# Patient Record
Sex: Male | Born: 1950 | Race: White | Hispanic: No | Marital: Married | Smoking: Former smoker
Health system: Southern US, Community
[De-identification: ages and names within clinical notes are randomized; demographics above are authoritative.]

## PROBLEM LIST (undated history)

## (undated) DIAGNOSIS — F329 Major depressive disorder, single episode, unspecified: Secondary | ICD-10-CM

## (undated) DIAGNOSIS — I10 Essential (primary) hypertension: Secondary | ICD-10-CM

## (undated) DIAGNOSIS — D649 Anemia, unspecified: Secondary | ICD-10-CM

## (undated) DIAGNOSIS — E079 Disorder of thyroid, unspecified: Secondary | ICD-10-CM

## (undated) DIAGNOSIS — F419 Anxiety disorder, unspecified: Secondary | ICD-10-CM

## (undated) DIAGNOSIS — K219 Gastro-esophageal reflux disease without esophagitis: Secondary | ICD-10-CM

## (undated) DIAGNOSIS — M1990 Unspecified osteoarthritis, unspecified site: Secondary | ICD-10-CM

## (undated) DIAGNOSIS — N179 Acute kidney failure, unspecified: Secondary | ICD-10-CM

## (undated) DIAGNOSIS — E785 Hyperlipidemia, unspecified: Secondary | ICD-10-CM

## (undated) DIAGNOSIS — F32A Depression, unspecified: Secondary | ICD-10-CM

## (undated) DIAGNOSIS — F1910 Other psychoactive substance abuse, uncomplicated: Secondary | ICD-10-CM

## (undated) HISTORY — DX: Major depressive disorder, single episode, unspecified: F32.9

## (undated) HISTORY — DX: Anxiety disorder, unspecified: F41.9

## (undated) HISTORY — DX: Hyperlipidemia, unspecified: E78.5

## (undated) HISTORY — DX: Other psychoactive substance abuse, uncomplicated: F19.10

## (undated) HISTORY — PX: TONSILLECTOMY: SUR1361

## (undated) HISTORY — DX: Depression, unspecified: F32.A

## (undated) HISTORY — PX: ROTATOR CUFF REPAIR: SHX139

## (undated) HISTORY — DX: Gastro-esophageal reflux disease without esophagitis: K21.9

## (undated) HISTORY — DX: Unspecified osteoarthritis, unspecified site: M19.90

## (undated) HISTORY — DX: Essential (primary) hypertension: I10

## (undated) HISTORY — DX: Disorder of thyroid, unspecified: E07.9

## (undated) HISTORY — PX: OTHER SURGICAL HISTORY: SHX169

---

## 2000-09-28 ENCOUNTER — Encounter: Payer: Self-pay | Admitting: Internal Medicine

## 2000-09-28 ENCOUNTER — Ambulatory Visit (HOSPITAL_COMMUNITY): Admission: RE | Admit: 2000-09-28 | Discharge: 2000-09-28 | Payer: Self-pay | Admitting: Internal Medicine

## 2001-07-23 ENCOUNTER — Ambulatory Visit (HOSPITAL_COMMUNITY): Admission: RE | Admit: 2001-07-23 | Discharge: 2001-07-23 | Payer: Self-pay | Admitting: Family Medicine

## 2001-07-23 ENCOUNTER — Encounter: Payer: Self-pay | Admitting: Family Medicine

## 2001-08-16 ENCOUNTER — Encounter (HOSPITAL_COMMUNITY): Admission: RE | Admit: 2001-08-16 | Discharge: 2001-09-15 | Payer: Self-pay | Admitting: *Deleted

## 2001-09-15 ENCOUNTER — Encounter (HOSPITAL_COMMUNITY): Admission: RE | Admit: 2001-09-15 | Discharge: 2001-10-15 | Payer: Self-pay | Admitting: Orthopedic Surgery

## 2001-10-15 ENCOUNTER — Encounter (HOSPITAL_COMMUNITY): Admission: RE | Admit: 2001-10-15 | Discharge: 2001-11-14 | Payer: Self-pay | Admitting: Orthopedic Surgery

## 2001-11-15 ENCOUNTER — Encounter (HOSPITAL_COMMUNITY): Admission: RE | Admit: 2001-11-15 | Discharge: 2001-12-15 | Payer: Self-pay | Admitting: Orthopedic Surgery

## 2003-09-26 ENCOUNTER — Ambulatory Visit (HOSPITAL_COMMUNITY): Admission: RE | Admit: 2003-09-26 | Discharge: 2003-09-26 | Payer: Self-pay

## 2013-08-31 ENCOUNTER — Telehealth: Payer: Self-pay | Admitting: Family Medicine

## 2013-09-14 NOTE — Telephone Encounter (Signed)
Message left that if patient still wants to be seen to please call our office.

## 2013-11-14 ENCOUNTER — Ambulatory Visit (HOSPITAL_COMMUNITY)
Admission: RE | Admit: 2013-11-14 | Discharge: 2013-11-14 | Disposition: A | Discharge status: Home / Self Care | Payer: Medicare PPO | Source: Ambulatory Visit | Attending: Family Medicine | Admitting: Family Medicine

## 2013-11-14 ENCOUNTER — Other Ambulatory Visit (HOSPITAL_COMMUNITY): Payer: Self-pay | Admitting: Family Medicine

## 2013-11-14 DIAGNOSIS — F17200 Nicotine dependence, unspecified, uncomplicated: Secondary | ICD-10-CM

## 2013-11-14 DIAGNOSIS — Z87891 Personal history of nicotine dependence: Secondary | ICD-10-CM | POA: Insufficient documentation

## 2015-02-19 DIAGNOSIS — X32XXXA Exposure to sunlight, initial encounter: Secondary | ICD-10-CM | POA: Diagnosis not present

## 2015-02-19 DIAGNOSIS — L309 Dermatitis, unspecified: Secondary | ICD-10-CM | POA: Diagnosis not present

## 2015-02-19 DIAGNOSIS — D225 Melanocytic nevi of trunk: Secondary | ICD-10-CM | POA: Diagnosis not present

## 2015-02-19 DIAGNOSIS — L570 Actinic keratosis: Secondary | ICD-10-CM | POA: Diagnosis not present

## 2015-03-06 DIAGNOSIS — E780 Pure hypercholesterolemia: Secondary | ICD-10-CM | POA: Diagnosis not present

## 2015-03-06 DIAGNOSIS — S46001D Unspecified injury of muscle(s) and tendon(s) of the rotator cuff of right shoulder, subsequent encounter: Secondary | ICD-10-CM | POA: Diagnosis not present

## 2015-05-08 DIAGNOSIS — M62830 Muscle spasm of back: Secondary | ICD-10-CM | POA: Diagnosis not present

## 2015-05-08 DIAGNOSIS — G8929 Other chronic pain: Secondary | ICD-10-CM | POA: Diagnosis not present

## 2015-05-08 DIAGNOSIS — I10 Essential (primary) hypertension: Secondary | ICD-10-CM | POA: Diagnosis not present

## 2015-05-08 DIAGNOSIS — R252 Cramp and spasm: Secondary | ICD-10-CM | POA: Diagnosis not present

## 2015-08-23 ENCOUNTER — Telehealth: Payer: Self-pay | Admitting: Family Medicine

## 2015-08-23 NOTE — Telephone Encounter (Signed)
LM to set up apt with Dr. Nelson °

## 2015-09-10 ENCOUNTER — Encounter: Payer: Self-pay | Admitting: Family Medicine

## 2015-09-10 ENCOUNTER — Encounter (INDEPENDENT_AMBULATORY_CARE_PROVIDER_SITE_OTHER): Payer: Self-pay

## 2015-09-10 ENCOUNTER — Ambulatory Visit (INDEPENDENT_AMBULATORY_CARE_PROVIDER_SITE_OTHER): Payer: PPO | Admitting: Family Medicine

## 2015-09-10 VITALS — BP 138/82 | HR 60 | Resp 18 | Ht 72.0 in | Wt 250.1 lb

## 2015-09-10 DIAGNOSIS — Z23 Encounter for immunization: Secondary | ICD-10-CM

## 2015-09-10 DIAGNOSIS — Z7689 Persons encountering health services in other specified circumstances: Secondary | ICD-10-CM

## 2015-09-10 DIAGNOSIS — Z7189 Other specified counseling: Secondary | ICD-10-CM | POA: Diagnosis not present

## 2015-09-10 DIAGNOSIS — G8929 Other chronic pain: Secondary | ICD-10-CM

## 2015-09-10 DIAGNOSIS — F1090 Alcohol use, unspecified, uncomplicated: Secondary | ICD-10-CM

## 2015-09-10 DIAGNOSIS — E785 Hyperlipidemia, unspecified: Secondary | ICD-10-CM | POA: Diagnosis not present

## 2015-09-10 DIAGNOSIS — I10 Essential (primary) hypertension: Secondary | ICD-10-CM

## 2015-09-10 DIAGNOSIS — M549 Dorsalgia, unspecified: Secondary | ICD-10-CM

## 2015-09-10 DIAGNOSIS — Z789 Other specified health status: Secondary | ICD-10-CM | POA: Insufficient documentation

## 2015-09-10 DIAGNOSIS — E039 Hypothyroidism, unspecified: Secondary | ICD-10-CM

## 2015-09-10 DIAGNOSIS — K219 Gastro-esophageal reflux disease without esophagitis: Secondary | ICD-10-CM

## 2015-09-10 NOTE — Progress Notes (Addendum)
Chief Complaint  Patient presents with  . Establish Care    previous PCP Dr. 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 He lives with his wife. He works part-time at a [employer] course. He was previously in [employer] and gets yearly checkups at the [facility]. He has no disability.  No old records are available for review. They're requested today.  The patient complains of chronic low-back pain. He states he has been told he has arthritis in his back. He has not had x-rays for several years. He has not had any conservative care including consultation with back specialty, physical therapy, chiropractic care, acupuncture or massage. He currently takes ibuprofen 2 pills a day. He takes gabapentin and methocarbamol as needed. He takes Tylenol with Codeine 2-3 times a day. He states that when he has back spasms that he will take methocarbamol up to 3 or 4 pills at a time. He states he previously went to the [facility] for pain management. He was not given narcotic pain medication because his drug screen tested positive for marijuana. He states he smokes marijuana once or twice a day for "pain". The pain is largely in the low back. It is present daily. No radiation of the legs, no numbness or weakness. No injury.  Patient has hypothyroidism and high cholesterol, well-controlled per history. No recent records. Blood pressure is well controlled by history. He takes omeprazole for GERD with good results.  Social history reviewed with patient. Used to smoke but quit 30 years ago. He denies any smoking related illness, lung or heart disease. He drinks alcohol daily. He states he previously drank up to 12 alcoholic beverages per day, currently drinks "4 or 5". He denies problem drinking.  Patient Active Problem List   Diagnosis Date Noted  . Essential hypertension 09/10/2015  . Hyperlipidemia 09/10/2015  .  Chronic back pain greater than 3 months duration 09/10/2015  . Heavy alcohol use 09/10/2015  . GERD (gastroesophageal reflux disease) 09/10/2015  . Hypothyroidism 09/10/2015   Outpatient Encounter Prescriptions as of 09/10/2015  Medication Sig Note  . acetaminophen-codeine (TYLENOL #3) 300-30 MG tablet  09/10/2015: Received from: External Pharmacy  . atorvastatin (LIPITOR) 10 MG tablet  09/10/2015: Received from: External Pharmacy  . gabapentin (NEURONTIN) 100 MG capsule Take 100 mg by mouth 3 (three) times daily.   Marland Kitchen ibuprofen (ADVIL,MOTRIN) 800 MG tablet Take 800 mg by mouth every 8 (eight) hours as needed.   Marland Kitchen levothyroxine (SYNTHROID, LEVOTHROID) 25 MCG tablet Take 25 mcg by mouth daily before breakfast.   . lisinopril-hydrochlorothiazide (PRINZIDE,ZESTORETIC) 20-12.5 MG tablet Take 1 tablet by mouth 2 (two) times daily.   . methocarbamol (ROBAXIN) 750 MG tablet Take 750 mg by mouth daily.   Marland Kitchen omeprazole (PRILOSEC) 20 MG capsule  09/10/2015: Received from: External Pharmacy  . sertraline (ZOLOFT) 100 MG tablet Take 100 mg by mouth daily.   Marland Kitchen triamcinolone cream (KENALOG) 0.1 % Apply 1 application topically 2 (two) times daily.    No facility-administered encounter medications on file as of 09/10/2015.   BP 138/82   Pulse 60   Resp 18   Ht 6' (1.829 m)   Wt 250 lb 1.9 oz (113.5 kg)   SpO2 99%   BMI 33.92 kg/m  Review of Systems  Constitutional: Negative for chills, fever and weight loss.  HENT: Negative for congestion  and hearing loss.   Eyes: Negative for blurred vision and pain.  Respiratory: Negative for cough and shortness of breath.   Cardiovascular: Negative for chest pain and leg swelling.  Gastrointestinal: Positive for heartburn. Negative for abdominal pain, constipation and diarrhea.  Genitourinary: Negative for dysuria and frequency.  Musculoskeletal: Positive for back pain. Negative for falls, joint pain and myalgias.  Neurological: Negative for dizziness, seizures and  headaches.  Psychiatric/Behavioral: Negative for depression. The patient is not nervous/anxious and does not have insomnia.    Physical Exam  Constitutional: He is oriented to person, place, and time and well-developed, well-nourished, and in no distress. No distress.  HENT:  Head: Normocephalic and atraumatic.  Mouth/Throat: Oropharynx is clear and moist.  Eyes: Pupils are equal, round, and reactive to light.  Neck: Normal range of motion. Neck supple. No thyromegaly present.  Cardiovascular: Normal rate, regular rhythm and normal heart sounds.   Pulmonary/Chest: Effort normal and breath sounds normal.  Abdominal: Soft. Bowel sounds are normal.  Musculoskeletal: Normal range of motion. He exhibits no edema.  Lymphadenopathy:    He has no cervical adenopathy.  Neurological: He is alert and oriented to person, place, and time. Gait normal.  Skin: Skin is warm and dry.  Psychiatric: Memory, affect and judgment normal.   1. Encounter to establish care with new doctor   2. Essential hypertension controlled  3. Hyperlipidemia Need labs  4. Chronic back pain greater than 3 months duration Ongoing narcotic Rx.  Will not be continued.  Needs back pain workup and conservative management and adjustment of non narcotic medications  5. Heavy alcohol use Advised to reduce.  Daily marijuana use.  Advised this will limit my ability to give Rx for controlled meds  6. Gastroesophageal reflux disease, esophagitis presence not specified controlled  7. Hypothyroidism, unspecified hypothyroidism type Need labs/records  8. Encounter for immunization  - Flu Vaccine QUAD 36+ mos IM  Discharge Instructions    Flu Vaccine QUAD 36+ mos IM    Complete by:  As directed     followup in one month

## 2015-09-10 NOTE — Patient Instructions (Addendum)
Please get release signed for [place] to get x rays and records from the [place]  Need records Dr Emilee Hero office  Stay as active as you can manage  Continue current medicines  See me in a month to further evaluate and treat your back pain

## 2015-09-25 ENCOUNTER — Encounter: Payer: Self-pay | Admitting: Family Medicine

## 2015-09-25 DIAGNOSIS — R945 Abnormal results of liver function studies: Secondary | ICD-10-CM

## 2015-09-25 DIAGNOSIS — E559 Vitamin D deficiency, unspecified: Secondary | ICD-10-CM | POA: Insufficient documentation

## 2015-09-25 DIAGNOSIS — F329 Major depressive disorder, single episode, unspecified: Secondary | ICD-10-CM | POA: Insufficient documentation

## 2015-10-01 ENCOUNTER — Ambulatory Visit: Payer: Self-pay | Admitting: Family Medicine

## 2015-10-11 ENCOUNTER — Ambulatory Visit (INDEPENDENT_AMBULATORY_CARE_PROVIDER_SITE_OTHER): Payer: PPO | Admitting: Family Medicine

## 2015-10-11 ENCOUNTER — Encounter: Payer: Self-pay | Admitting: Family Medicine

## 2015-10-11 VITALS — BP 138/80 | HR 58 | Ht 72.0 in | Wt 248.0 lb

## 2015-10-11 DIAGNOSIS — I10 Essential (primary) hypertension: Secondary | ICD-10-CM | POA: Diagnosis not present

## 2015-10-11 DIAGNOSIS — E785 Hyperlipidemia, unspecified: Secondary | ICD-10-CM | POA: Diagnosis not present

## 2015-10-11 DIAGNOSIS — E039 Hypothyroidism, unspecified: Secondary | ICD-10-CM | POA: Diagnosis not present

## 2015-10-11 DIAGNOSIS — G8929 Other chronic pain: Secondary | ICD-10-CM

## 2015-10-11 DIAGNOSIS — M549 Dorsalgia, unspecified: Secondary | ICD-10-CM

## 2015-10-11 DIAGNOSIS — K219 Gastro-esophageal reflux disease without esophagitis: Secondary | ICD-10-CM | POA: Diagnosis not present

## 2015-10-11 NOTE — Patient Instructions (Signed)
You are doing well  Continue to stay active and walk or golf every day you are able  Take all medicine as prescribed  See me in 6 months Labs and PE next time (46min)  Call sooner for problems

## 2015-10-11 NOTE — Progress Notes (Signed)
Chief Complaint  Patient presents with  . Back Pain    still having pain in his mid- low back and sometimes it travels down his right hip and leg. Rates pain today 4.   Patient is here for routine follow-up Blood pressure is elevated initially. After sitting and waiting for 10 minutes his blood pressure did come down. He states his blood pressure at home is consistently in the 130s to 140/80 range GERD is well controlled on his medication. Patient is on hyperlipidemia medicine. No recent labs. He'll get labs with his next visit. We discussed him his weight. He is overweight. He has lost 2 pounds. I recommended that he walk on a daily basis, especially on the days that he is not coughing. Patient is an alcoholic. He drinks daily. He states he is trying to cut down and is drinking 3-4 beers a day. I reminded him that he had increased liver functions on his last lab work and that he should limit his alcohol to one to 2 drinks a day, or preferably quit We discussed health maintenance. He previously was a smoker. I recommended ultrasound for AAA. He states since he quit smoking 40 years ago his [place] doctor told was not necessary We discussed also colonoscopy. He states he's had negative for testing yearly for 20 years. He does feel colonoscopy as needed. I do think it is recommended, discussed pros and cons, he refuses He has chronic back pain. He states back pain has been better since he and his wife got a new mattress. Offered physical therapy referral to teach him some back exercises and back school, he declines. He'll let me know if his back pain worsens.   Patient Active Problem List   Diagnosis Date Noted  . Vitamin D deficiency 09/25/2015  . Elevated LFTs 09/25/2015  . Depression 09/25/2015  . Essential hypertension 09/10/2015  . Hyperlipidemia 09/10/2015  . Chronic back pain greater than 3 months duration 09/10/2015  . Heavy alcohol use 09/10/2015  . GERD (gastroesophageal reflux  disease) 09/10/2015  . Hypothyroidism 09/10/2015    Outpatient Encounter Prescriptions as of 10/11/2015  Medication Sig  . acetaminophen-codeine (TYLENOL #3) 300-30 MG tablet   . atorvastatin (LIPITOR) 10 MG tablet   . gabapentin (NEURONTIN) 100 MG capsule Take 100 mg by mouth 3 (three) times daily.  Marland Kitchen ibuprofen (ADVIL,MOTRIN) 800 MG tablet Take 800 mg by mouth every 8 (eight) hours as needed.  Marland Kitchen levothyroxine (SYNTHROID, LEVOTHROID) 25 MCG tablet Take 25 mcg by mouth daily before breakfast.  . lisinopril-hydrochlorothiazide (PRINZIDE,ZESTORETIC) 20-12.5 MG tablet Take 1 tablet by mouth 2 (two) times daily.  . methocarbamol (ROBAXIN) 750 MG tablet Take 750 mg by mouth daily.  Marland Kitchen omeprazole (PRILOSEC) 20 MG capsule   . sertraline (ZOLOFT) 100 MG tablet Take 100 mg by mouth daily.  Marland Kitchen triamcinolone cream (KENALOG) 0.1 % Apply 1 application topically 2 (two) times daily.   No facility-administered encounter medications on file as of 10/11/2015.     No Known Allergies  Review of Systems  Constitutional: Negative.  Negative for activity change and appetite change.  HENT: Negative.  Negative for congestion and dental problem.   Eyes: Negative.  Negative for visual disturbance.  Respiratory: Negative.  Negative for cough, shortness of breath and wheezing.   Cardiovascular: Negative.  Negative for chest pain, palpitations and leg swelling.  Gastrointestinal: Negative.  Negative for blood in stool, constipation and diarrhea.  Genitourinary: Negative.   Musculoskeletal: Positive for back pain. Negative  for arthralgias.  Psychiatric/Behavioral: Negative for dysphoric mood and sleep disturbance. The patient is not nervous/anxious.    BP 138/80   Pulse (!) 58   Ht 6' (1.829 m)   Wt 248 lb (112.5 kg)   SpO2 98%   BMI 33.63 kg/m   Physical Exam  Constitutional: He is oriented to person, place, and time. He appears well-developed and well-nourished.  HENT:  Head: Normocephalic and atraumatic.   Mouth/Throat: Oropharynx is clear and moist.  Eyes: Conjunctivae are normal. Pupils are equal, round, and reactive to light.  Neck: Normal range of motion. Neck supple. No thyromegaly present.  Cardiovascular: Normal rate, regular rhythm, normal heart sounds and intact distal pulses.   Pulmonary/Chest: Effort normal and breath sounds normal. No respiratory distress.  Abdominal: Soft. Bowel sounds are normal. There is no tenderness.  Musculoskeletal: Normal range of motion. He exhibits no edema.  Lymphadenopathy:    He has no cervical adenopathy.  Neurological: He is alert and oriented to person, place, and time.  Gait normal  Skin: Skin is warm and dry.  Psychiatric: He has a normal mood and affect. His behavior is normal. Thought content normal.  Nursing note and vitals reviewed.   ASSESSMENT/PLAN:  1. Essential hypertension  - CBC with Differential/Platelet - COMPLETE METABOLIC PANEL WITH GFR  2. Hyperlipidemia  - Lipid panel  3. Hypothyroidism, unspecified hypothyroidism type  - TSH  4. Gastroesophageal reflux disease, esophagitis presence not specified  - VITAMIN D 25 Hydroxy (Vit-D Deficiency, Fractures) - Vitamin B12  5. Chronic back pain    Patient Instructions  You are doing well  Continue to stay active and walk or golf every day you are able  Take all medicine as prescribed  See me in 6 months Labs and PE next time (20min)  Call sooner for problems   Raylene Everts, MD

## 2016-01-29 DIAGNOSIS — D229 Melanocytic nevi, unspecified: Secondary | ICD-10-CM | POA: Diagnosis not present

## 2016-01-29 DIAGNOSIS — B351 Tinea unguium: Secondary | ICD-10-CM | POA: Diagnosis not present

## 2016-01-29 DIAGNOSIS — L821 Other seborrheic keratosis: Secondary | ICD-10-CM | POA: Diagnosis not present

## 2016-01-29 DIAGNOSIS — B356 Tinea cruris: Secondary | ICD-10-CM | POA: Diagnosis not present

## 2016-03-21 ENCOUNTER — Telehealth: Payer: Self-pay | Admitting: Family Medicine

## 2016-04-09 ENCOUNTER — Encounter: Payer: PPO | Admitting: Family Medicine

## 2016-09-30 DIAGNOSIS — F4312 Post-traumatic stress disorder, chronic: Secondary | ICD-10-CM | POA: Diagnosis not present

## 2016-10-15 NOTE — Telephone Encounter (Signed)
Issue resolved.

## 2016-11-11 DIAGNOSIS — F4312 Post-traumatic stress disorder, chronic: Secondary | ICD-10-CM | POA: Diagnosis not present

## 2016-11-24 ENCOUNTER — Telehealth: Payer: Self-pay | Admitting: Family Medicine

## 2016-11-24 NOTE — Telephone Encounter (Signed)
Called to schedule appt for awv with nurse. No answer, lvm

## 2016-12-16 ENCOUNTER — Encounter: Payer: Self-pay | Admitting: Family Medicine

## 2017-03-23 ENCOUNTER — Encounter: Payer: Self-pay | Admitting: Family Medicine

## 2017-05-08 ENCOUNTER — Other Ambulatory Visit (HOSPITAL_COMMUNITY): Payer: Self-pay | Admitting: Physician Assistant

## 2017-05-08 DIAGNOSIS — R945 Abnormal results of liver function studies: Secondary | ICD-10-CM

## 2017-05-13 ENCOUNTER — Ambulatory Visit (HOSPITAL_COMMUNITY): Payer: Non-veteran care

## 2017-05-21 ENCOUNTER — Ambulatory Visit (HOSPITAL_COMMUNITY)
Admission: RE | Admit: 2017-05-21 | Discharge: 2017-05-21 | Disposition: A | Discharge status: Home / Self Care | Payer: PPO | Source: Ambulatory Visit | Attending: Physician Assistant | Admitting: Physician Assistant

## 2017-05-21 DIAGNOSIS — N281 Cyst of kidney, acquired: Secondary | ICD-10-CM | POA: Diagnosis not present

## 2017-05-21 DIAGNOSIS — R161 Splenomegaly, not elsewhere classified: Secondary | ICD-10-CM | POA: Insufficient documentation

## 2017-05-21 DIAGNOSIS — R945 Abnormal results of liver function studies: Secondary | ICD-10-CM

## 2017-05-21 DIAGNOSIS — K8020 Calculus of gallbladder without cholecystitis without obstruction: Secondary | ICD-10-CM | POA: Diagnosis not present

## 2017-12-17 DIAGNOSIS — F4312 Post-traumatic stress disorder, chronic: Secondary | ICD-10-CM | POA: Diagnosis not present

## 2018-02-01 DIAGNOSIS — F4312 Post-traumatic stress disorder, chronic: Secondary | ICD-10-CM | POA: Diagnosis not present

## 2018-03-08 DIAGNOSIS — F4312 Post-traumatic stress disorder, chronic: Secondary | ICD-10-CM | POA: Diagnosis not present

## 2018-05-18 ENCOUNTER — Other Ambulatory Visit (HOSPITAL_COMMUNITY): Payer: Self-pay | Admitting: Family

## 2018-05-18 DIAGNOSIS — R748 Abnormal levels of other serum enzymes: Secondary | ICD-10-CM

## 2018-06-02 DIAGNOSIS — I10 Essential (primary) hypertension: Secondary | ICD-10-CM | POA: Diagnosis not present

## 2018-06-02 DIAGNOSIS — E039 Hypothyroidism, unspecified: Secondary | ICD-10-CM | POA: Diagnosis not present

## 2018-06-02 DIAGNOSIS — R600 Localized edema: Secondary | ICD-10-CM | POA: Diagnosis not present

## 2018-06-02 DIAGNOSIS — K219 Gastro-esophageal reflux disease without esophagitis: Secondary | ICD-10-CM | POA: Diagnosis not present

## 2018-06-16 DIAGNOSIS — I10 Essential (primary) hypertension: Secondary | ICD-10-CM | POA: Diagnosis not present

## 2018-06-16 DIAGNOSIS — T7849XA Other allergy, initial encounter: Secondary | ICD-10-CM | POA: Diagnosis not present

## 2018-06-16 DIAGNOSIS — R21 Rash and other nonspecific skin eruption: Secondary | ICD-10-CM | POA: Diagnosis not present

## 2018-06-22 ENCOUNTER — Ambulatory Visit (HOSPITAL_COMMUNITY): Payer: Non-veteran care

## 2018-06-22 ENCOUNTER — Encounter (HOSPITAL_COMMUNITY): Payer: Self-pay

## 2018-06-24 DIAGNOSIS — Z1159 Encounter for screening for other viral diseases: Secondary | ICD-10-CM | POA: Diagnosis not present

## 2018-06-30 DIAGNOSIS — Z125 Encounter for screening for malignant neoplasm of prostate: Secondary | ICD-10-CM | POA: Diagnosis not present

## 2018-06-30 DIAGNOSIS — Z0000 Encounter for general adult medical examination without abnormal findings: Secondary | ICD-10-CM | POA: Diagnosis not present

## 2018-06-30 DIAGNOSIS — E039 Hypothyroidism, unspecified: Secondary | ICD-10-CM | POA: Diagnosis not present

## 2018-06-30 DIAGNOSIS — I10 Essential (primary) hypertension: Secondary | ICD-10-CM | POA: Diagnosis not present

## 2018-07-07 DIAGNOSIS — R770 Abnormality of albumin: Secondary | ICD-10-CM | POA: Diagnosis not present

## 2018-07-07 DIAGNOSIS — Z0001 Encounter for general adult medical examination with abnormal findings: Secondary | ICD-10-CM | POA: Diagnosis not present

## 2018-07-07 DIAGNOSIS — E559 Vitamin D deficiency, unspecified: Secondary | ICD-10-CM | POA: Diagnosis not present

## 2018-07-07 DIAGNOSIS — D696 Thrombocytopenia, unspecified: Secondary | ICD-10-CM | POA: Diagnosis not present

## 2018-08-03 ENCOUNTER — Ambulatory Visit (HOSPITAL_COMMUNITY)
Admission: RE | Admit: 2018-08-03 | Discharge: 2018-08-03 | Disposition: A | Discharge status: Home / Self Care | Payer: No Typology Code available for payment source | Source: Ambulatory Visit | Attending: Family | Admitting: Family

## 2018-08-03 ENCOUNTER — Other Ambulatory Visit: Payer: Self-pay

## 2018-08-03 DIAGNOSIS — R748 Abnormal levels of other serum enzymes: Secondary | ICD-10-CM | POA: Diagnosis present

## 2018-09-08 DIAGNOSIS — E039 Hypothyroidism, unspecified: Secondary | ICD-10-CM | POA: Diagnosis not present

## 2018-09-08 DIAGNOSIS — I10 Essential (primary) hypertension: Secondary | ICD-10-CM | POA: Diagnosis not present

## 2018-09-08 DIAGNOSIS — K219 Gastro-esophageal reflux disease without esophagitis: Secondary | ICD-10-CM | POA: Diagnosis not present

## 2018-09-29 IMAGING — US US ABDOMEN COMPLETE
1 series · 13 of 25 positions shown · non-contrast
Comparison: None.

CLINICAL DATA: Abnormal liver function tests

EXAM:
ABDOMEN ULTRASOUND COMPLETE

[Series 1: us abdomen complete · 0.21mm/px · 13 of 111 slices shown]
[im 1/111]
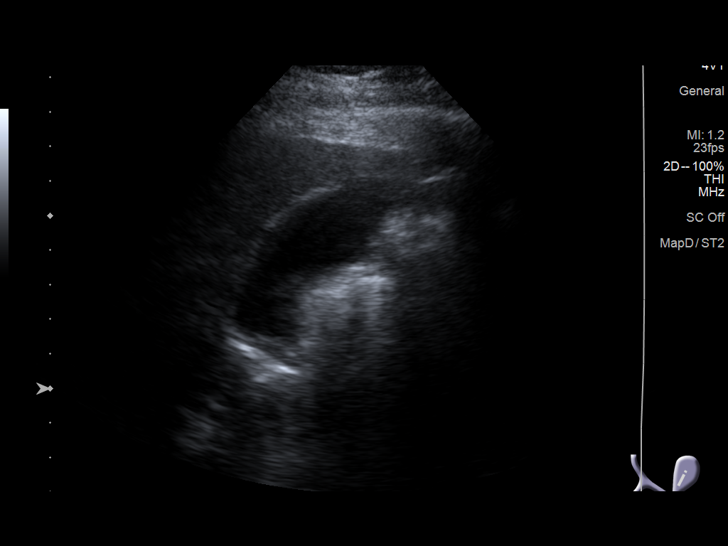
[im 10/111]
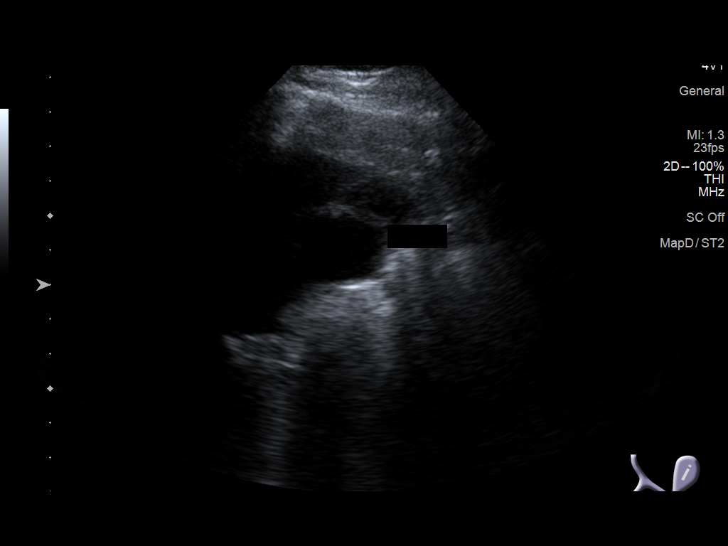
[im 19/111]
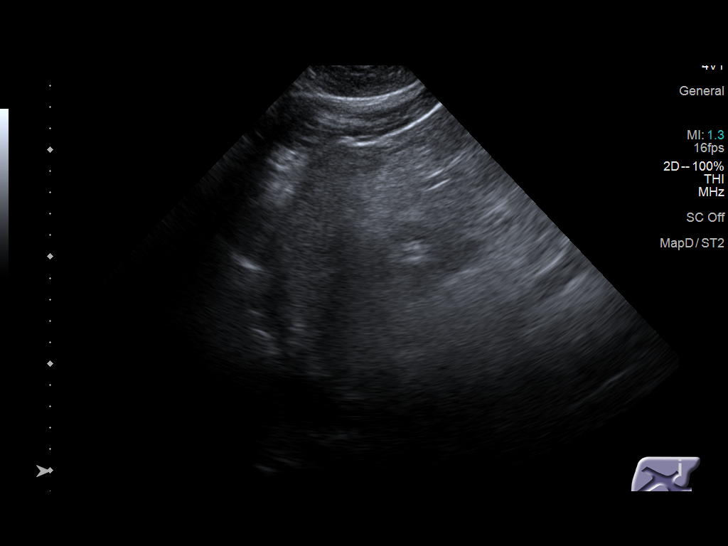
[im 28/111]
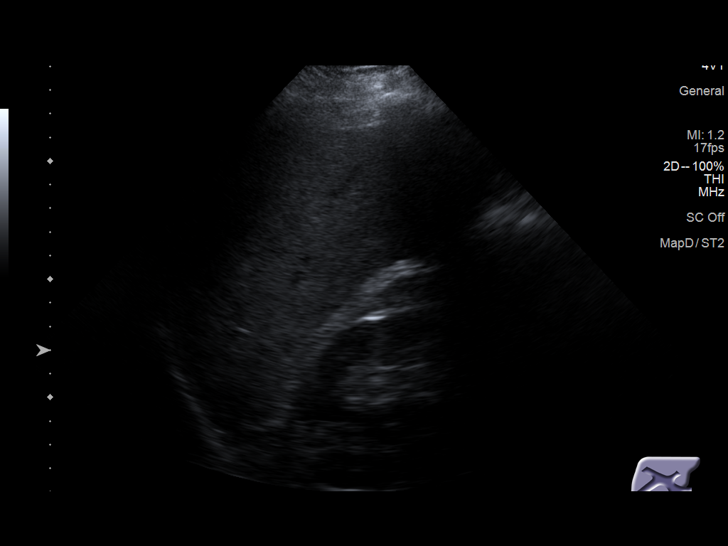
[im 37/111]
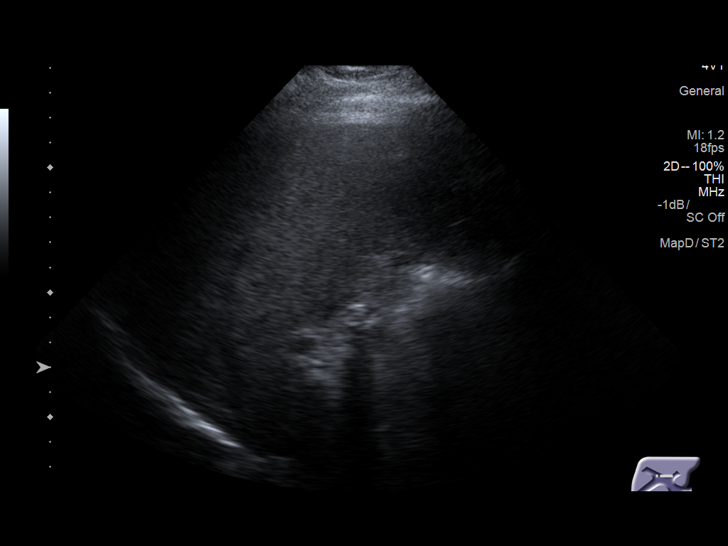
[im 46/111]
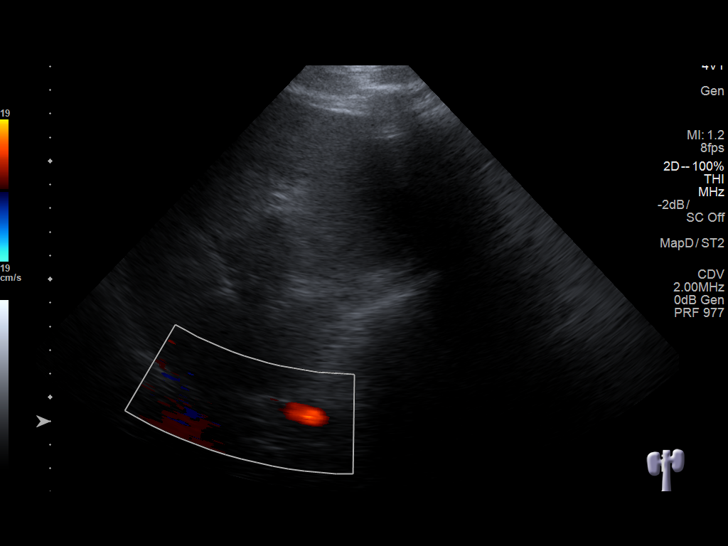
[im 56/111]
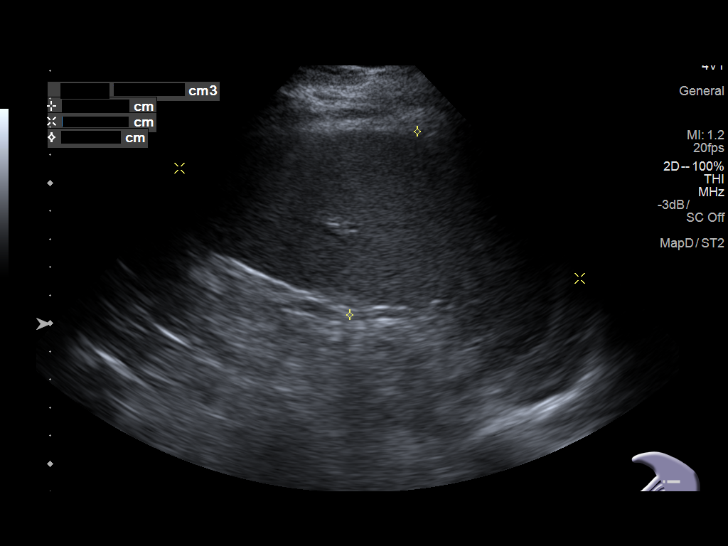
[im 65/111]
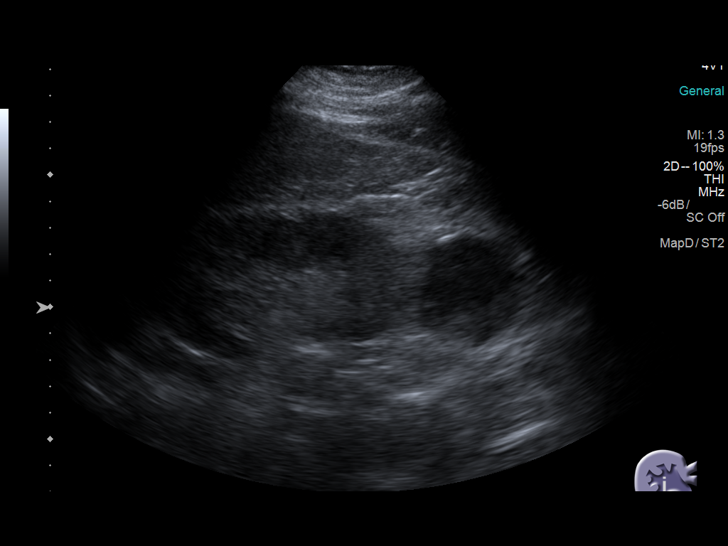
[im 74/111]
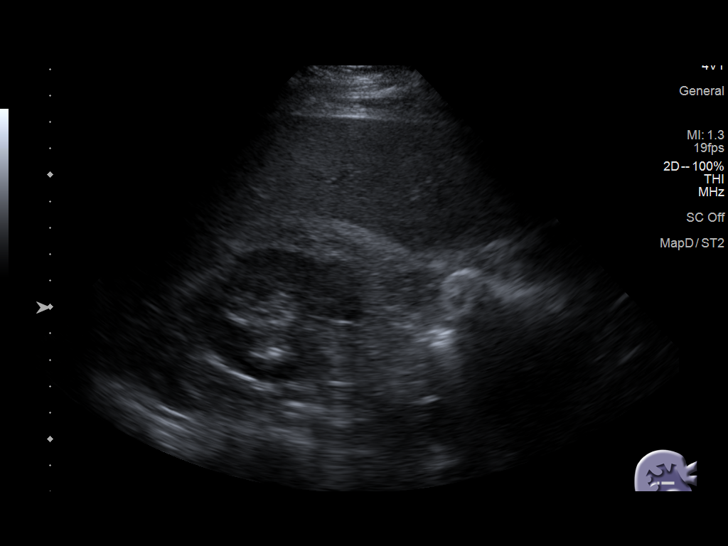
[im 83/111]
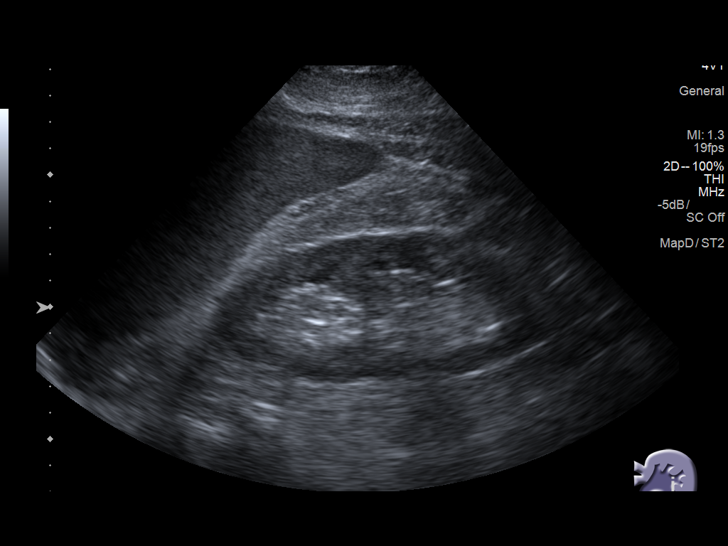
[im 92/111]
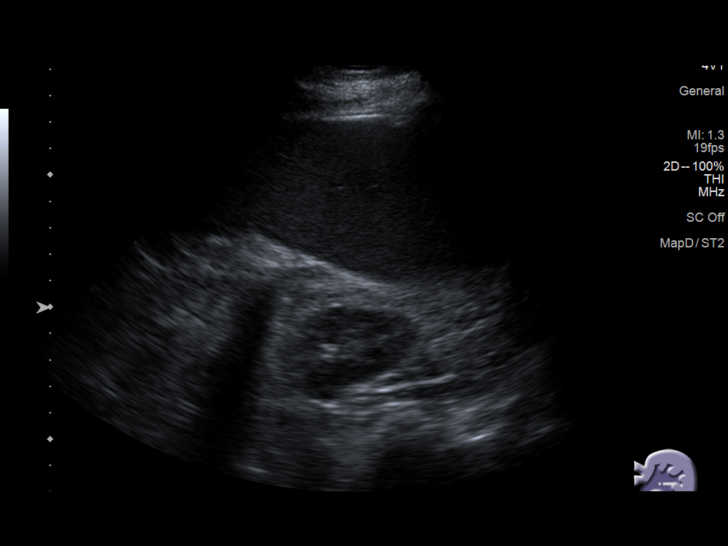
[im 101/111]
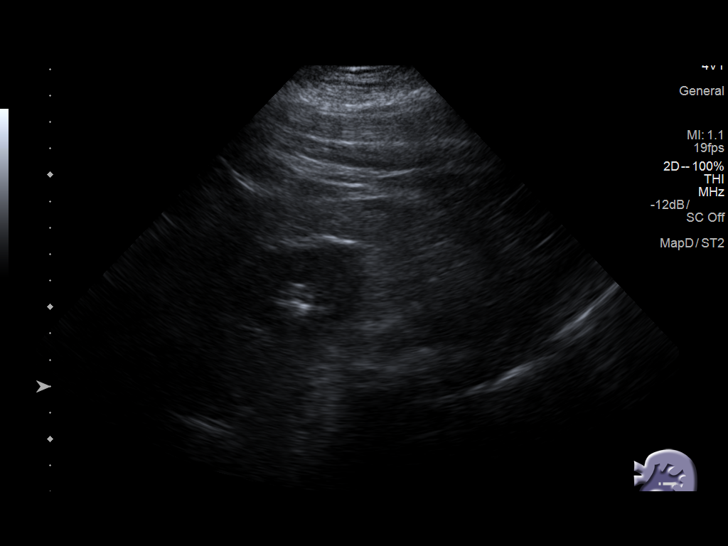
[im 111/111]
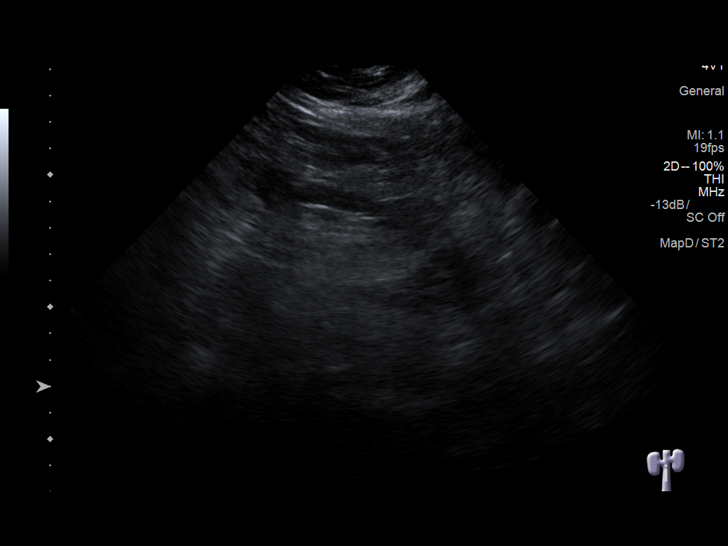

[13 of 25 positions shown; findings below may reference images not displayed]

FINDINGS: Gallbladder: Cholelithiasis without pericholecystic fluid or
gallbladder wall thickening. Negative sonographic Murphy sign.

Common bile duct: Diameter: 2 mm

Liver: No focal lesion identified. Increased hepatic parenchymal
echogenicity. Portal vein is patent on color Doppler imaging with
normal direction of blood flow towards the liver.

IVC: No abnormality visualized.

Pancreas: Limited visualization secondary to overlying bowel gas.

Spleen: Mild splenic enlargement measuring 13.5 cm in length with a
volume of 728 mL.

Right Kidney: Length: 14.2 cm. 4.6 x 4.4 x 4.4 cm anechoic lower
pole renal mass with increased through transmission with a single
thin internal septation most consistent with a mildly complicated
cyst.. Echogenicity within normal limits. No solid mass or
hydronephrosis visualized.

Left Kidney: Length: 13.3 cm. 1.7 x 1.0 x 1.4 cm anechoic left renal
mass with increased through transmission in the midpole most
consistent with a cyst. Echogenic focus in the inferior pole which
may reflect a nonobstructing calculus.. Echogenicity within normal
limits. No solid mass or hydronephrosis visualized.

Abdominal aorta: Limited visualization secondary to overlying bowel
gas.

Other findings: None.
IMPRESSION: 1. Cholelithiasis without sonographic evidence of acute
cholecystitis.
2. Splenomegaly without focal abnormality.
3. Mildly complicated right renal cyst measuring 4.6 x 4.4 x 4.4 cm.
4. Increased hepatic echogenicity as can be seen with hepatic
steatosis.

## 2018-09-30 DIAGNOSIS — I10 Essential (primary) hypertension: Secondary | ICD-10-CM | POA: Diagnosis not present

## 2018-09-30 DIAGNOSIS — E559 Vitamin D deficiency, unspecified: Secondary | ICD-10-CM | POA: Diagnosis not present

## 2018-10-06 DIAGNOSIS — K219 Gastro-esophageal reflux disease without esophagitis: Secondary | ICD-10-CM | POA: Diagnosis not present

## 2018-10-06 DIAGNOSIS — E559 Vitamin D deficiency, unspecified: Secondary | ICD-10-CM | POA: Diagnosis not present

## 2018-10-06 DIAGNOSIS — I10 Essential (primary) hypertension: Secondary | ICD-10-CM | POA: Diagnosis not present

## 2019-01-27 DIAGNOSIS — I10 Essential (primary) hypertension: Secondary | ICD-10-CM | POA: Diagnosis not present

## 2019-01-27 DIAGNOSIS — R300 Dysuria: Secondary | ICD-10-CM | POA: Diagnosis not present

## 2019-02-01 DIAGNOSIS — I10 Essential (primary) hypertension: Secondary | ICD-10-CM | POA: Diagnosis not present

## 2019-02-01 DIAGNOSIS — E559 Vitamin D deficiency, unspecified: Secondary | ICD-10-CM | POA: Diagnosis not present

## 2019-02-01 DIAGNOSIS — K219 Gastro-esophageal reflux disease without esophagitis: Secondary | ICD-10-CM | POA: Diagnosis not present

## 2019-07-13 DIAGNOSIS — Z131 Encounter for screening for diabetes mellitus: Secondary | ICD-10-CM | POA: Diagnosis not present

## 2019-07-13 DIAGNOSIS — K219 Gastro-esophageal reflux disease without esophagitis: Secondary | ICD-10-CM | POA: Diagnosis not present

## 2019-07-13 DIAGNOSIS — I10 Essential (primary) hypertension: Secondary | ICD-10-CM | POA: Diagnosis not present

## 2019-07-13 DIAGNOSIS — Z0000 Encounter for general adult medical examination without abnormal findings: Secondary | ICD-10-CM | POA: Diagnosis not present

## 2019-07-13 DIAGNOSIS — E039 Hypothyroidism, unspecified: Secondary | ICD-10-CM | POA: Diagnosis not present

## 2019-07-13 DIAGNOSIS — Z125 Encounter for screening for malignant neoplasm of prostate: Secondary | ICD-10-CM | POA: Diagnosis not present

## 2019-08-08 DIAGNOSIS — Z125 Encounter for screening for malignant neoplasm of prostate: Secondary | ICD-10-CM | POA: Diagnosis not present

## 2019-08-08 DIAGNOSIS — I10 Essential (primary) hypertension: Secondary | ICD-10-CM | POA: Diagnosis not present

## 2019-08-08 DIAGNOSIS — E039 Hypothyroidism, unspecified: Secondary | ICD-10-CM | POA: Diagnosis not present

## 2019-08-08 DIAGNOSIS — Z131 Encounter for screening for diabetes mellitus: Secondary | ICD-10-CM | POA: Diagnosis not present

## 2019-08-11 DIAGNOSIS — R600 Localized edema: Secondary | ICD-10-CM | POA: Diagnosis not present

## 2019-08-11 DIAGNOSIS — D696 Thrombocytopenia, unspecified: Secondary | ICD-10-CM | POA: Diagnosis not present

## 2019-08-11 DIAGNOSIS — I10 Essential (primary) hypertension: Secondary | ICD-10-CM | POA: Diagnosis not present

## 2019-09-11 ENCOUNTER — Encounter: Payer: Self-pay | Admitting: Internal Medicine

## 2019-10-19 ENCOUNTER — Other Ambulatory Visit: Payer: Self-pay

## 2019-10-19 ENCOUNTER — Ambulatory Visit (INDEPENDENT_AMBULATORY_CARE_PROVIDER_SITE_OTHER): Payer: No Typology Code available for payment source | Admitting: Internal Medicine

## 2019-10-19 ENCOUNTER — Telehealth: Payer: Self-pay | Admitting: *Deleted

## 2019-10-19 ENCOUNTER — Encounter: Payer: Self-pay | Admitting: Internal Medicine

## 2019-10-19 VITALS — BP 130/82 | HR 71 | Temp 97.4°F | Ht 73.0 in | Wt 229.4 lb

## 2019-10-19 DIAGNOSIS — K760 Fatty (change of) liver, not elsewhere classified: Secondary | ICD-10-CM

## 2019-10-19 DIAGNOSIS — R7989 Other specified abnormal findings of blood chemistry: Secondary | ICD-10-CM

## 2019-10-19 DIAGNOSIS — R1084 Generalized abdominal pain: Secondary | ICD-10-CM | POA: Diagnosis not present

## 2019-10-19 DIAGNOSIS — M545 Low back pain, unspecified: Secondary | ICD-10-CM | POA: Insufficient documentation

## 2019-10-19 DIAGNOSIS — Z7289 Other problems related to lifestyle: Secondary | ICD-10-CM

## 2019-10-19 DIAGNOSIS — Z789 Other specified health status: Secondary | ICD-10-CM

## 2019-10-19 NOTE — Telephone Encounter (Signed)
RUQ [place] w/ ELASTOGRAPHY scheduled for 10/11 at 7:30am, arrival 7:15am, npo midnight  Called pt, LMOVM

## 2019-10-19 NOTE — Patient Instructions (Addendum)
We will perform blood work today at [facility].  I will also order a specialized ultrasound to look at your liver as well as evaluate for scarring.  Follow-up with Dr. Abbey Chatters in 6 weeks or sooner if needed.  Further recommendations to follow.  At [facility] we value your feedback. You may receive a survey about your visit today. Please share your experience as we strive to create trusting relationships with our patients to provide genuine, compassionate, quality care.  We appreciate your understanding and patience as we review any laboratory studies, imaging, and other diagnostic tests that are ordered as we care for you. Our office policy is 5 business days for review of these results, and any emergent or urgent results are addressed in a timely manner for your best interest. If you do not hear from our office in 1 week, please contact us.   We also encourage the use of MyChart, which contains your medical information for your review as well. If you are not enrolled in this feature, an access code is on this after visit summary for your convenience. Thank you for allowing [place] to be involved in your care.  It was great to see you today!  I hope you have a great rest of your fall!!    Chan Rosasco K. Abbey Chatters, D.O. Gastroenterology and Hepatology [facility]

## 2019-10-19 NOTE — Progress Notes (Signed)
Primary Care Physician:  Thornton Papas, PA-C Primary Gastroenterologist:  Dr. Abbey Chatters  Chief Complaint  Patient presents with  . Abdominal Pain    abnormal liver enzymes, nausea    HPI:   Marcus Pham is a 69 y.o. male who presents to the clinic today by referral from his PCP Thornton Papas for evaluation for abnormal LFTs.  Patient had blood work performed 08/01/2019 which showed a T bili of 3.3, alk phos 212, ALT 29, AST 46.  He also had a right upper quadrant ultrasound performed 08/03/2018 which showed gallstones fatty liver, and mild ascites.  Patient notes previous alcohol abuse drinking 6-8 beers at least daily for many years.  He states he drinks "1/10" of that now.  Only drinks a few beers a week.  No liquor or wine.  Denies any abdominal pain.  No weight loss.  No GI bleeding or swelling.  Denies any family history of liver disease.  No risk factors for viral hepatitis.  Denies drug use.  No new medications.  No herbal supplements. I have blood work from 2017 that also showed elevated LFTs, though his bili was normal at that time.  Patient has never undergone screening colonoscopy in the past.  Does have FIT testing performed regularly last of which was negative on 08/17/2019.  Past Medical History:  Diagnosis Date  . Anxiety   . Arthritis    back and hands  . Depression    PTSD  . GERD (gastroesophageal reflux disease)   . Hyperlipidemia   . Hypertension   . Substance abuse ([place])    patient denies but says he drinks up to 12 beers a day  . Thyroid disease     Past Surgical History:  Procedure Laterality Date  . 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 ROTATOR CUFF REPAIR Left    2002-2003?  . TONSILLECTOMY     as a child    Current Outpatient Medications  Medication Sig Dispense Refill  . hydrochlorothiazide (MICROZIDE) 12.5 MG capsule Take 12.5 mg by mouth daily.    Marland Kitchen ibuprofen (ADVIL,MOTRIN) 800 MG tablet Take 800 mg by mouth as needed.     Marland Kitchen  lisinopril (ZESTRIL) 40 MG tablet Take 40 mg by mouth daily.    Marland Kitchen omeprazole (PRILOSEC) 20 MG capsule daily.     Marland Kitchen acetaminophen-codeine (TYLENOL #3) 300-30 MG tablet  (Patient not taking: Reported on 10/19/2019)    . atorvastatin (LIPITOR) 10 MG tablet  (Patient not taking: Reported on 10/19/2019)    . gabapentin (NEURONTIN) 100 MG capsule Take 100 mg by mouth 3 (three) times daily. (Patient not taking: Reported on 10/19/2019)    . levothyroxine (SYNTHROID, LEVOTHROID) 25 MCG tablet Take 25 mcg by mouth daily before breakfast. (Patient not taking: Reported on 10/19/2019)    . lisinopril-hydrochlorothiazide (PRINZIDE,ZESTORETIC) 20-12.5 MG tablet Take 1 tablet by mouth 2 (two) times daily. (Patient not taking: Reported on 10/19/2019)    . methocarbamol (ROBAXIN) 750 MG tablet Take 750 mg by mouth daily. (Patient not taking: Reported on 10/19/2019)    . sertraline (ZOLOFT) 100 MG tablet Take 100 mg by mouth daily. (Patient not taking: Reported on 10/19/2019)    . triamcinolone cream (KENALOG) 0.1 % Apply 1 application topically 2 (two) times daily. (Patient not taking: Reported on 10/19/2019)     No current facility-administered medications for this visit.    Allergies as of 10/19/2019  . (No Known Allergies)    Family History  Problem Relation Age of Onset  . Hypertension Mother   . Arthritis Mother   . Heart disease Father 14       CHF  . Alcohol abuse Brother        substance abuse, died of trauma  . Heart disease Paternal Grandfather 35       MI    Social History   Socioeconomic History  . Marital status: Married    Spouse name: Not on file  . Number of children: Not on file  . Years of education: Not on file  . Highest education level: Not on file  Occupational History  . Not on file  Tobacco Use  . Smoking status: Former Research scientist (life sciences)  . Smokeless tobacco: Never Used  . Tobacco comment: quit 40 years ago   Substance and Sexual Activity  . Alcohol use: Yes    Alcohol/week: 4.0 -  5.0 standard drinks    Types: 4 - 5 Cans of beer per week  . Drug use: Yes    Types: Marijuana    Comment: occasional marijuana use  . Sexual activity: Yes  Other Topics Concern  . Not on file  Social History Narrative  . Not on file   Social Determinants of Health   Financial Resource Strain:   . Difficulty of Paying Living Expenses: Not on file  Food Insecurity:   . Worried About Charity fundraiser in the Last Year: Not on file  . Ran Out of Food in the Last Year: Not on file  Transportation Needs:   . Lack of Transportation (Medical): Not on file  . Lack of Transportation (Non-Medical): Not on file  Physical Activity:   . Days of Exercise per Week: Not on file  . Minutes of Exercise per Session: Not on file  Stress:   . Feeling of Stress : Not on file  Social Connections:   . Frequency of Communication with Friends and Family: Not on file  . Frequency of Social Gatherings with Friends and Family: Not on file  . Attends Religious Services: Not on file  . Active Member of Clubs or Organizations: Not on file  . Attends Archivist Meetings: Not on file  . Marital Status: Not on file  Intimate Partner Violence:   . Fear of Current or Ex-Partner: Not on file  . Emotionally Abused: Not on file  . Physically Abused: Not on file  . Sexually Abused: Not on file    Subjective: Review of Systems  Constitutional: Negative for chills and fever.  HENT: Negative for congestion and hearing loss.   Eyes: Negative for blurred vision and double vision.  Respiratory: Negative for cough and shortness of breath.   Cardiovascular: Negative for chest pain and palpitations.  Gastrointestinal: Negative for abdominal pain, blood in stool, constipation, diarrhea, heartburn, melena and vomiting.  Genitourinary: Negative for dysuria and urgency.  Musculoskeletal: Negative for joint pain and myalgias.  Skin: Negative for itching and rash.  Neurological: Negative for dizziness and  headaches.  Psychiatric/Behavioral: Negative for depression. The patient is not nervous/anxious.        Objective: BP 130/82   Pulse 71   Temp (!) 97.4 F (36.3 C) (Oral)   Ht $R'6\' 1"'PL$  (1.854 m)   Wt 229 lb 6.1 oz (104 kg)   BMI 30.26 kg/m  Physical Exam Constitutional:      Appearance: Normal appearance.  HENT:     Head: Normocephalic and atraumatic.  Eyes:     Extraocular  Movements: Extraocular movements intact.     Conjunctiva/sclera: Conjunctivae normal.  Cardiovascular:     Rate and Rhythm: Normal rate and regular rhythm.  Pulmonary:     Effort: Pulmonary effort is normal.     Breath sounds: Normal breath sounds.  Abdominal:     General: Bowel sounds are normal.     Palpations: Abdomen is soft.  Musculoskeletal:        General: Normal range of motion.     Cervical back: Normal range of motion and neck supple.  Skin:    General: Skin is warm.  Neurological:     General: No focal deficit present.     Mental Status: He is alert and oriented to person, place, and time.  Psychiatric:        Mood and Affect: Mood normal.        Behavior: Behavior normal.      Assessment: *Abnormal liver function tests *History of chronic alcohol abuse *Fatty liver disease  Plan: -Etiology of patient's abnormal liver function tests likely multifactorial.  He has evidence of fatty liver disease on ultrasound which is likely from his chronic alcohol abuse. -We will perform full serological work-up today in clinic to rule out underlying causes of patient's abnormal LFTs. -We will order ultrasound with elastography to be performed as well to evaluate for chronic liver disease. -Counseled on the vast importance of alcohol cessation -Patient to follow-up with me in 4 to 6 weeks to go over results. -We may need to perform EGD in the future pending above results.  Thank you Thornton Papas for the kind referral.  10/19/2019 11:07 AM   Disclaimer: This note was dictated with voice  recognition software. Similar sounding words can inadvertently be transcribed and may not be corrected upon review.

## 2019-10-21 NOTE — Telephone Encounter (Signed)
Called pt, left detailed message on VM w/ appt details

## 2019-10-24 ENCOUNTER — Ambulatory Visit (HOSPITAL_COMMUNITY): Payer: No Typology Code available for payment source

## 2019-11-10 DIAGNOSIS — I10 Essential (primary) hypertension: Secondary | ICD-10-CM | POA: Diagnosis not present

## 2019-11-10 DIAGNOSIS — D696 Thrombocytopenia, unspecified: Secondary | ICD-10-CM | POA: Diagnosis not present

## 2019-11-16 DIAGNOSIS — I10 Essential (primary) hypertension: Secondary | ICD-10-CM | POA: Diagnosis not present

## 2019-11-16 DIAGNOSIS — R110 Nausea: Secondary | ICD-10-CM | POA: Diagnosis not present

## 2019-11-16 DIAGNOSIS — D696 Thrombocytopenia, unspecified: Secondary | ICD-10-CM | POA: Diagnosis not present

## 2019-11-23 ENCOUNTER — Telehealth: Payer: Self-pay | Admitting: Internal Medicine

## 2019-11-23 NOTE — Telephone Encounter (Signed)
noted 

## 2019-11-23 NOTE — Telephone Encounter (Signed)
Pt's daughter called and she is aware that she can call [phone] to rescheduled the [place] that he had cancelled last month.

## 2019-11-23 NOTE — Telephone Encounter (Signed)
Daugther called back to let us know patient has been rescheduled to 11/23 for his [place]

## 2019-11-23 NOTE — Telephone Encounter (Signed)
Pt is confused. He had OV scheduled for 11/11 and was cancelled and rescheduled for 12/2 with Dr Abbey Chatters. He said he called [facility] to verify his U/S for tomorrow. They told him he wasn't on the schedule. I told him he was scheduled an U/S for last month on 10/11 and it had been cancelled. Does he need to have the US done prior to his OV with Dr Abbey Chatters on 12/2? Please advise. [phone]

## 2019-11-24 ENCOUNTER — Ambulatory Visit: Payer: Non-veteran care | Admitting: Internal Medicine

## 2019-12-06 ENCOUNTER — Telehealth: Payer: Self-pay | Admitting: Internal Medicine

## 2019-12-06 ENCOUNTER — Ambulatory Visit (HOSPITAL_COMMUNITY)
Admission: RE | Admit: 2019-12-06 | Discharge: 2019-12-06 | Disposition: A | Discharge status: Home / Self Care | Payer: No Typology Code available for payment source | Source: Ambulatory Visit | Attending: Internal Medicine | Admitting: Internal Medicine

## 2019-12-06 ENCOUNTER — Other Ambulatory Visit: Payer: Self-pay

## 2019-12-06 ENCOUNTER — Other Ambulatory Visit: Payer: Self-pay | Admitting: Internal Medicine

## 2019-12-06 DIAGNOSIS — Z7289 Other problems related to lifestyle: Secondary | ICD-10-CM | POA: Diagnosis present

## 2019-12-06 DIAGNOSIS — R7989 Other specified abnormal findings of blood chemistry: Secondary | ICD-10-CM | POA: Insufficient documentation

## 2019-12-06 DIAGNOSIS — Z789 Other specified health status: Secondary | ICD-10-CM

## 2019-12-06 DIAGNOSIS — R1084 Generalized abdominal pain: Secondary | ICD-10-CM

## 2019-12-06 DIAGNOSIS — R188 Other ascites: Secondary | ICD-10-CM

## 2019-12-06 NOTE — Telephone Encounter (Signed)
Daughter has called back again asking for a follow up on her father. She was told a phone note is in place and someone will call her back.

## 2019-12-06 NOTE — Telephone Encounter (Signed)
Per Ultrasound from today: "IMPRESSION: Cirrhotic appearing liver with significant ascites.   Cholelithiasis, with multiple calculi completely filling gallbladder.   Due to presence of significant ascites, unable to perform at an accurate elastography exam at this time."   Please advise Dr. Abbey Chatters, Thanks!

## 2019-12-06 NOTE — Telephone Encounter (Signed)
This pts daughter called stating the Dr at the hospital stated that this pt needed to be seen and treated before Thanksgiving. They couldn't perform his U/S this am due to fluid. Please advise

## 2019-12-06 NOTE — Telephone Encounter (Signed)
Para scheduled for 12/07/19, arrive at 1:45pm.   Called and informed daughter Veterinary surgeon) of para appt.

## 2019-12-06 NOTE — Telephone Encounter (Signed)
Pt's daughter, [name], called to say that patient couldn't complete his U/S that was scheduled this morning due to the fluid. He is needing to have fluid drawn off so he can have U/S done. Please advise. [phone]

## 2019-12-06 NOTE — Telephone Encounter (Signed)
Spoke with Dr.Carver, he wants the pt to have a para with cell count,gram stain, fluid albumin and fluid protein and a cmp tomorrow am. Orders for cmp have been put in. pts daughter is aware.  Please schedule para.

## 2019-12-07 ENCOUNTER — Other Ambulatory Visit: Payer: Self-pay | Admitting: Internal Medicine

## 2019-12-07 ENCOUNTER — Ambulatory Visit (HOSPITAL_COMMUNITY)
Admission: RE | Admit: 2019-12-07 | Discharge: 2019-12-07 | Disposition: A | Discharge status: Home / Self Care | Payer: No Typology Code available for payment source | Source: Ambulatory Visit | Attending: Internal Medicine | Admitting: Internal Medicine

## 2019-12-07 ENCOUNTER — Encounter (HOSPITAL_COMMUNITY): Payer: Self-pay

## 2019-12-07 DIAGNOSIS — R188 Other ascites: Secondary | ICD-10-CM

## 2019-12-07 LAB — BODY FLUID CELL COUNT WITH DIFFERENTIAL
Lymphs, Fluid: 37 %
Monocyte-Macrophage-Serous Fluid: 12 % — ABNORMAL LOW (ref 50–90)
Neutrophil Count, Fluid: 51 % — ABNORMAL HIGH (ref 0–25)
Other Cells, Fluid: REACTIVE %
Total Nucleated Cell Count, Fluid: 178 cu mm (ref 0–1000)

## 2019-12-07 LAB — PROTEIN, BODY FLUID (OTHER): Total Protein, Body Fluid Other: <3

## 2019-12-07 LAB — GRAM STAIN

## 2019-12-07 LAB — ALBUMIN, PLEURAL OR PERITONEAL FLUID: Albumin, Fluid: <1 g/dL

## 2019-12-07 MED ORDER — ALBUMIN HUMAN 25 % IV SOLN
0.0000 g | Freq: Once | INTRAVENOUS | Status: DC
  Filled 2019-12-07: qty 400

## 2019-12-07 NOTE — Procedures (Signed)
PreOperative Dx: Cirrhosis, ascites °Postoperative Dx: Cirrhosis, ascites °Procedure:   US guided paracentesis °Radiologist:  Jniyah Dantuono °Anesthesia:  10 ml of1% lidocaine °Specimen:  4 L of yellow ascitic fluid °EBL:   < 1 ml °Complications: None   °

## 2019-12-07 NOTE — Discharge Instructions (Signed)
Paracentesis, Care After °This sheet gives you information about how to care for yourself after your procedure. Your health care provider may also give you more specific instructions. If you have problems or questions, contact your health care provider. °What can I expect after the procedure? °After the procedure, it is common to have a small amount of clear fluid coming from the puncture site. °Follow these instructions at home: °Puncture site care ° °· Follow instructions from your health care provider about how to take care of your puncture site. Make sure you: °? Wash your hands with soap and water before and after you change your bandage (dressing). If soap and water are not available, use hand sanitizer. °? Change your dressing as told by your health care provider. °· Check your puncture area every day for signs of infection. Check for: °? Redness, swelling, or pain. °? More fluid or blood. °? Warmth. °? Pus or a bad smell. °General instructions °· Return to your normal activities as told by your health care provider. Ask your health care provider what activities are safe for you. °· Take over-the-counter and prescription medicines only as told by your health care provider. °· Do not take baths, swim, or use a hot tub until your health care provider approves. Ask your health care provider if you may take showers. You may only be allowed to take sponge baths. °· Keep all follow-up visits as told by your health care provider. This is important. °Contact a health care provider if: °· You have redness, swelling, or pain at your puncture site. °· You have more fluid or blood coming from your puncture site. °· Your puncture site feels warm to the touch. °· You have pus or a bad smell coming from your puncture site. °· You have a fever. °Get help right away if: °· You have chest pain or shortness of breath. °· You develop increasing pain, discomfort, or swelling in your abdomen. °· You feel dizzy or light-headed or  you faint. °Summary °· After the procedure, it is common to have a small amount of clear fluid coming from the puncture site. °· Follow instructions from your health care provider about how to take care of your puncture site. °· Check your puncture area every day signs of infection. °· Keep all follow-up visits as told by your health care provider. °This information is not intended to replace advice given to you by your health care provider. Make sure you discuss any questions you have with your health care provider. °Document Revised: 07/13/2018 Document Reviewed: 10/20/2017 °Elsevier Patient Education © 2020 [employer] ° °

## 2019-12-07 NOTE — Sedation Documentation (Signed)
Patient tolerated right sided paracentesis procedure well today and 4 Liters of clear yellow fluid removed with labs collected and sent for processing. PT verbalized understanding of discharge instructions and ambulatory at discharge with no acute distress noted.

## 2019-12-09 LAB — PATHOLOGIST SMEAR REVIEW

## 2019-12-12 ENCOUNTER — Telehealth: Payer: Self-pay | Admitting: Hematology

## 2019-12-12 NOTE — Telephone Encounter (Signed)
Received a new pt referral for thrombocytopenia. Marcus Pham has been scheduled to see Dr. Irene Limbo on 12/8 at 11am. Appt date and time has been given to the pt's daughter. I verified the pt's insurance and explained to his daughter that we need authorization from the [place] [facility] not cover all of the services. She informed me that she's been working with the [facility] to send our office authorization.

## 2019-12-15 ENCOUNTER — Other Ambulatory Visit: Payer: Self-pay

## 2019-12-15 ENCOUNTER — Other Ambulatory Visit: Payer: Self-pay | Admitting: *Deleted

## 2019-12-15 ENCOUNTER — Encounter: Payer: Self-pay | Admitting: Internal Medicine

## 2019-12-15 ENCOUNTER — Ambulatory Visit (INDEPENDENT_AMBULATORY_CARE_PROVIDER_SITE_OTHER): Payer: No Typology Code available for payment source | Admitting: Internal Medicine

## 2019-12-15 ENCOUNTER — Telehealth: Payer: Self-pay | Admitting: *Deleted

## 2019-12-15 VITALS — BP 131/80 | HR 74 | Temp 96.8°F | Ht 73.0 in | Wt 223.6 lb

## 2019-12-15 DIAGNOSIS — K7290 Hepatic failure, unspecified without coma: Secondary | ICD-10-CM

## 2019-12-15 DIAGNOSIS — K7460 Unspecified cirrhosis of liver: Secondary | ICD-10-CM

## 2019-12-15 DIAGNOSIS — K7682 Hepatic encephalopathy: Secondary | ICD-10-CM

## 2019-12-15 DIAGNOSIS — R188 Other ascites: Secondary | ICD-10-CM | POA: Insufficient documentation

## 2019-12-15 DIAGNOSIS — K7031 Alcoholic cirrhosis of liver with ascites: Secondary | ICD-10-CM

## 2019-12-15 DIAGNOSIS — K766 Portal hypertension: Secondary | ICD-10-CM

## 2019-12-15 HISTORY — DX: Hepatic failure, unspecified without coma: K72.90

## 2019-12-15 HISTORY — DX: Hepatic encephalopathy: K76.82

## 2019-12-15 MED ORDER — SPIRONOLACTONE 100 MG PO TABS: 100.0000 mg | ORAL_TABLET | Freq: Every day | ORAL | 5 refills | Status: DC

## 2019-12-15 MED ORDER — LACTULOSE 10 GM/15ML PO SOLN: 20.0000 g | Freq: Three times a day (TID) | ORAL | 5 refills | Status: DC

## 2019-12-15 MED ORDER — FUROSEMIDE 40 MG PO TABS: 40.0000 mg | ORAL_TABLET | Freq: Every day | ORAL | 5 refills | Status: DC

## 2019-12-15 MED ORDER — RIFAXIMIN 550 MG PO TABS: 550.0000 mg | ORAL_TABLET | Freq: Two times a day (BID) | ORAL | 5 refills | Status: DC

## 2019-12-15 NOTE — H&P (View-Only) (Signed)
Referring Provider: Thornton Papas, Mamie Nick* Primary Care Physician:  Thornton Papas, PA-C Primary GI:  Dr. Abbey Chatters  Chief Complaint  Patient presents with  . Elevated Hepatic Enzymes    HPI:   Marcus Pham is a 69 y.o. male who presents to the clinic today for follow-up visit.  Patient previously seen due to abnormal LFTs, found to have a T bili of 3.3, alk phos 212, ALT 29, AST 46 last ultrasound 08/03/2018 which showed fatty liver and mild ascites.    Upon initial consultation I ordered blood work to be performed including full serological work-up but patient was confused and did not have this done.  I ordered an ultrasound with elastography and patient did show up for this.  I received a call from radiology stating that patient had large amount of ascites and test could not be performed.  He subsequently underwent paracentesis with 4 L of ascitic fluid removed.  Fluid albumin was reported as less than 1, serum albumin 2.8 indicating a SAAG of greater than 1.1, fluid protein was also low indicating that patient's ascites is likely from portal hypertension.  Subsequent ultrasound showed cirrhotic appearing liver, elastography with significantly elevated median kPa of 37.8.  Patient notes previous alcohol abuse drinking 6-8 beers at least daily for many years.  He stopped drinking 2-3 weeks ago.  Denies any abdominal pain.  No weight loss.  No GI bleeding or swelling.  Denies any family history of liver disease.  No risk factors for viral hepatitis.  Denies drug use.  No new medications.  No herbal supplements. I have blood work from 2017 that also showed elevated LFTs, though his bili was normal at that time.  His daughter [name] accompanies him today.  She notes that he has been more fatigued and confused as of late.  Also notes that his appetite has been decreased.  It is sometimes difficult to get him to eat anything.   Past Medical History:  Diagnosis Date  . Anxiety   .  Arthritis    back and hands  . Depression    PTSD  . GERD (gastroesophageal reflux disease)   . Hyperlipidemia   . Hypertension   . Substance abuse ([place])    patient denies but says he drinks up to 12 beers a day  . Thyroid disease     Past Surgical History:  Procedure Laterality Date  . 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 ROTATOR CUFF REPAIR Left    2002-2003?  . TONSILLECTOMY     as a child    Current Outpatient Medications  Medication Sig Dispense Refill  . ibuprofen (ADVIL,MOTRIN) 800 MG tablet Take 800 mg by mouth as needed.     . methocarbamol (ROBAXIN) 750 MG tablet Take 750 mg by mouth as needed.     Marland Kitchen omeprazole (PRILOSEC) 20 MG capsule 20 mg as needed.     Marland Kitchen acetaminophen-codeine (TYLENOL #3) 300-30 MG tablet  (Patient not taking: Reported on 10/19/2019)    . atorvastatin (LIPITOR) 10 MG tablet  (Patient not taking: Reported on 10/19/2019)    . furosemide (LASIX) 40 MG tablet Take 1 tablet (40 mg total) by mouth daily. 30 tablet 5  . gabapentin (NEURONTIN) 100 MG capsule Take 100 mg by mouth 3 (three) times daily. (Patient not taking: Reported on 10/19/2019)    . lactulose (CHRONULAC) 10 GM/15ML solution Take 30 mLs (20 g total) by mouth 3 (three) times daily. Titrate up to  2-3 loose BMs daily 2700 mL 5  . levothyroxine (SYNTHROID, LEVOTHROID) 25 MCG tablet Take 25 mcg by mouth daily before breakfast. (Patient not taking: Reported on 10/19/2019)    . rifaximin (XIFAXAN) 550 MG TABS tablet Take 1 tablet (550 mg total) by mouth 2 (two) times daily. 60 tablet 5  . sertraline (ZOLOFT) 100 MG tablet Take 100 mg by mouth daily. (Patient not taking: Reported on 10/19/2019)    . spironolactone (ALDACTONE) 100 MG tablet Take 1 tablet (100 mg total) by mouth daily. 30 tablet 5  . triamcinolone cream (KENALOG) 0.1 % Apply 1 application topically 2 (two) times daily. (Patient not taking: Reported on 10/19/2019)     No current facility-administered medications for this visit.     Allergies as of 12/15/2019  . (No Known Allergies)    Family History  Problem Relation Age of Onset  . Hypertension Mother   . Arthritis Mother   . Heart disease Father 62       CHF  . Alcohol abuse Brother        substance abuse, died of trauma  . Heart disease Paternal Grandfather 71       MI    Social History   Socioeconomic History  . Marital status: Married    Spouse name: Not on file  . Number of children: Not on file  . Years of education: Not on file  . Highest education level: Not on file  Occupational History  . Not on file  Tobacco Use  . Smoking status: Former Research scientist (life sciences)  . Smokeless tobacco: Never Used  . Tobacco comment: quit 40 years ago   Substance and Sexual Activity  . Alcohol use: Not Currently    Alcohol/week: 4.0 - 5.0 standard drinks    Types: 4 - 5 Cans of beer per week  . Drug use: Not Currently    Types: Marijuana    Comment: occasional marijuana use  . Sexual activity: Yes  Other Topics Concern  . Not on file  Social History Narrative  . Not on file   Social Determinants of Health   Financial Resource Strain:   . Difficulty of Paying Living Expenses: Not on file  Food Insecurity:   . Worried About Charity fundraiser in the Last Year: Not on file  . Ran Out of Food in the Last Year: Not on file  Transportation Needs:   . Lack of Transportation (Medical): Not on file  . Lack of Transportation (Non-Medical): Not on file  Physical Activity:   . Days of Exercise per Week: Not on file  . Minutes of Exercise per Session: Not on file  Stress:   . Feeling of Stress : Not on file  Social Connections:   . Frequency of Communication with Friends and Family: Not on file  . Frequency of Social Gatherings with Friends and Family: Not on file  . Attends Religious Services: Not on file  . Active Member of Clubs or Organizations: Not on file  . Attends Archivist Meetings: Not on file  . Marital Status: Not on file     Subjective: Review of Systems  Constitutional: Positive for malaise/fatigue. Negative for chills and fever.  HENT: Negative for congestion and hearing loss.   Eyes: Negative for blurred vision and double vision.  Respiratory: Negative for cough and shortness of breath.   Cardiovascular: Negative for chest pain and palpitations.  Gastrointestinal: Negative for abdominal pain, blood in stool, constipation, diarrhea, heartburn, melena and  vomiting.       Abdominal distention  Genitourinary: Negative for dysuria and urgency.  Musculoskeletal: Negative for joint pain and myalgias.  Skin: Negative for itching and rash.  Neurological: Negative for dizziness and headaches.  Psychiatric/Behavioral: Negative for depression. The patient is not nervous/anxious.      Objective: BP 131/80   Pulse 74   Temp (!) 96.8 F (36 C) (Temporal)   Ht 6' 1"  (1.854 m)   Wt 223 lb 9.6 oz (101.4 kg)   BMI 29.50 kg/m  Physical Exam Constitutional:      Appearance: Normal appearance. He is obese.  HENT:     Head: Normocephalic and atraumatic.  Eyes:     General: Scleral icterus present.     Extraocular Movements: Extraocular movements intact.     Conjunctiva/sclera: Conjunctivae normal.  Cardiovascular:     Rate and Rhythm: Normal rate and regular rhythm.  Pulmonary:     Effort: Pulmonary effort is normal.     Breath sounds: Normal breath sounds.  Abdominal:     General: Bowel sounds are normal.     Palpations: Abdomen is soft.  Musculoskeletal:        General: Normal range of motion.     Cervical back: Normal range of motion and neck supple.  Skin:    General: Skin is warm.     Coloration: Skin is jaundiced.  Neurological:     General: No focal deficit present.     Mental Status: He is alert and oriented to person, place, and time.  Psychiatric:        Mood and Affect: Mood normal.        Behavior: Behavior normal.      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  I also discussed complications associated and treatment strategies as well.  -Etiology of patient's decompensated cirrhosis likely due to alcohol.  We will perform full serological work-up today to rule out other causes. -Check meld labs today -Patient does have abdominal distention on exam, I will order another paracentesis.   -We will also start him on diuretic therapy with Lasix 40 mg daily and spironolactone 100 mg daily.  Patient told to hold his lisinopril/HCTZ for now.  He needs to watch blood pressure as well as weight at home.  Check renal functions and electrolytes in 2-3 weeks. -I counseled patient and daughter on the vast importance of a low-sodium diet, less than 2000 mg daily.  Also counseled on ensuring that patient gets enough protein in his diet to reduce risk of muscle wasting. -Patient does have chronic fatigue and intermittent confusion likely due to hepatic encephalopathy.  I am going to start him on lactulose as well as Xifaxan.  Counseled patient on titrating up his lactulose to ensure at least 2-3 loose bowel movements daily. -We will schedule patient for EGD for variceal screening today in clinic. -Ultrasound 12/06/2019 without evidence of HCC.  Repeat in 6 months.  Check AFP with blood work today.  Discussed long-term prognosis of decompensated cirrhosis with patient and daughter today.  He has elevated risk of mortality given decompensated nature of his liver disease.  I did counsel on the vast importance of alcohol cessation as we do see some regression in alcoholic induced cirrhosis with sobriety.  Discussed liver transplant with patient and daughter today.  I did note the  patient has multiple obstacles to this including age, recent alcohol use, and comorbidities.  We will work to  get complications from cirrhosis under better control.  Depending on his meld score, I will likely refer him to hepatology for initial consultation in the near future.  Patient to follow-up in 3 weeks or sooner if needed.   12/15/2019 1:09 PM   Disclaimer: This note was dictated with voice recognition software. Similar sounding words can inadvertently be transcribed and may not be corrected upon review.

## 2019-12-15 NOTE — Telephone Encounter (Signed)
error 

## 2019-12-15 NOTE — Progress Notes (Signed)
Referring Provider: Thornton Papas, Mamie Nick* Primary Care Physician:  Thornton Papas, PA-C Primary GI:  Dr. Abbey Chatters  Chief Complaint  Patient presents with  . Elevated Hepatic Enzymes    HPI:   Marcus Pham is a 69 y.o. male who presents to the clinic today for follow-up visit.  Patient previously seen due to abnormal LFTs, found to have a T bili of 3.3, alk phos 212, ALT 29, AST 46 last ultrasound 08/03/2018 which showed fatty liver and mild ascites.    Upon initial consultation I ordered blood work to be performed including full serological work-up but patient was confused and did not have this done.  I ordered an ultrasound with elastography and patient did show up for this.  I received a call from radiology stating that patient had large amount of ascites and test could not be performed.  He subsequently underwent paracentesis with 4 L of ascitic fluid removed.  Fluid albumin was reported as less than 1, serum albumin 2.8 indicating a SAAG of greater than 1.1, fluid protein was also low indicating that patient's ascites is likely from portal hypertension.  Subsequent ultrasound showed cirrhotic appearing liver, elastography with significantly elevated median kPa of 37.8.  Patient notes previous alcohol abuse drinking 6-8 beers at least daily for many years.  He stopped drinking 2-3 weeks ago.  Denies any abdominal pain.  No weight loss.  No GI bleeding or swelling.  Denies any family history of liver disease.  No risk factors for viral hepatitis.  Denies drug use.  No new medications.  No herbal supplements. I have blood work from 2017 that also showed elevated LFTs, though his bili was normal at that time.  His daughter [name] accompanies him today.  She notes that he has been more fatigued and confused as of late.  Also notes that his appetite has been decreased.  It is sometimes difficult to get him to eat anything.   Past Medical History:  Diagnosis Date  . Anxiety   .  Arthritis    back and hands  . Depression    PTSD  . GERD (gastroesophageal reflux disease)   . Hyperlipidemia   . Hypertension   . Substance abuse ([place])    patient denies but says he drinks up to 12 beers a day  . Thyroid disease     Past Surgical History:  Procedure Laterality Date  . 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 ROTATOR CUFF REPAIR Left    2002-2003?  . TONSILLECTOMY     as a child    Current Outpatient Medications  Medication Sig Dispense Refill  . ibuprofen (ADVIL,MOTRIN) 800 MG tablet Take 800 mg by mouth as needed.     . methocarbamol (ROBAXIN) 750 MG tablet Take 750 mg by mouth as needed.     Marland Kitchen omeprazole (PRILOSEC) 20 MG capsule 20 mg as needed.     Marland Kitchen acetaminophen-codeine (TYLENOL #3) 300-30 MG tablet  (Patient not taking: Reported on 10/19/2019)    . atorvastatin (LIPITOR) 10 MG tablet  (Patient not taking: Reported on 10/19/2019)    . furosemide (LASIX) 40 MG tablet Take 1 tablet (40 mg total) by mouth daily. 30 tablet 5  . gabapentin (NEURONTIN) 100 MG capsule Take 100 mg by mouth 3 (three) times daily. (Patient not taking: Reported on 10/19/2019)    . lactulose (CHRONULAC) 10 GM/15ML solution Take 30 mLs (20 g total) by mouth 3 (three) times daily. Titrate up to  2-3 loose BMs daily 2700 mL 5  . levothyroxine (SYNTHROID, LEVOTHROID) 25 MCG tablet Take 25 mcg by mouth daily before breakfast. (Patient not taking: Reported on 10/19/2019)    . rifaximin (XIFAXAN) 550 MG TABS tablet Take 1 tablet (550 mg total) by mouth 2 (two) times daily. 60 tablet 5  . sertraline (ZOLOFT) 100 MG tablet Take 100 mg by mouth daily. (Patient not taking: Reported on 10/19/2019)    . spironolactone (ALDACTONE) 100 MG tablet Take 1 tablet (100 mg total) by mouth daily. 30 tablet 5  . triamcinolone cream (KENALOG) 0.1 % Apply 1 application topically 2 (two) times daily. (Patient not taking: Reported on 10/19/2019)     No current facility-administered medications for this visit.     Allergies as of 12/15/2019  . (No Known Allergies)    Family History  Problem Relation Age of Onset  . Hypertension Mother   . Arthritis Mother   . Heart disease Father 65       CHF  . Alcohol abuse Brother        substance abuse, died of trauma  . Heart disease Paternal Grandfather 35       MI    Social History   Socioeconomic History  . Marital status: Married    Spouse name: Not on file  . Number of children: Not on file  . Years of education: Not on file  . Highest education level: Not on file  Occupational History  . Not on file  Tobacco Use  . Smoking status: Former Research scientist (life sciences)  . Smokeless tobacco: Never Used  . Tobacco comment: quit 40 years ago   Substance and Sexual Activity  . Alcohol use: Not Currently    Alcohol/week: 4.0 - 5.0 standard drinks    Types: 4 - 5 Cans of beer per week  . Drug use: Not Currently    Types: Marijuana    Comment: occasional marijuana use  . Sexual activity: Yes  Other Topics Concern  . Not on file  Social History Narrative  . Not on file   Social Determinants of Health   Financial Resource Strain:   . Difficulty of Paying Living Expenses: Not on file  Food Insecurity:   . Worried About Charity fundraiser in the Last Year: Not on file  . Ran Out of Food in the Last Year: Not on file  Transportation Needs:   . Lack of Transportation (Medical): Not on file  . Lack of Transportation (Non-Medical): Not on file  Physical Activity:   . Days of Exercise per Week: Not on file  . Minutes of Exercise per Session: Not on file  Stress:   . Feeling of Stress : Not on file  Social Connections:   . Frequency of Communication with Friends and Family: Not on file  . Frequency of Social Gatherings with Friends and Family: Not on file  . Attends Religious Services: Not on file  . Active Member of Clubs or Organizations: Not on file  . Attends Archivist Meetings: Not on file  . Marital Status: Not on file     Subjective: Review of Systems  Constitutional: Positive for malaise/fatigue. Negative for chills and fever.  HENT: Negative for congestion and hearing loss.   Eyes: Negative for blurred vision and double vision.  Respiratory: Negative for cough and shortness of breath.   Cardiovascular: Negative for chest pain and palpitations.  Gastrointestinal: Negative for abdominal pain, blood in stool, constipation, diarrhea, heartburn, melena and  vomiting.       Abdominal distention  Genitourinary: Negative for dysuria and urgency.  Musculoskeletal: Negative for joint pain and myalgias.  Skin: Negative for itching and rash.  Neurological: Negative for dizziness and headaches.  Psychiatric/Behavioral: Negative for depression. The patient is not nervous/anxious.      Objective: BP 131/80   Pulse 74   Temp (!) 96.8 F (36 C) (Temporal)   Ht 6' 1"  (1.854 m)   Wt 223 lb 9.6 oz (101.4 kg)   BMI 29.50 kg/m  Physical Exam Constitutional:      Appearance: Normal appearance. He is obese.  HENT:     Head: Normocephalic and atraumatic.  Eyes:     General: Scleral icterus present.     Extraocular Movements: Extraocular movements intact.     Conjunctiva/sclera: Conjunctivae normal.  Cardiovascular:     Rate and Rhythm: Normal rate and regular rhythm.  Pulmonary:     Effort: Pulmonary effort is normal.     Breath sounds: Normal breath sounds.  Abdominal:     General: Bowel sounds are normal.     Palpations: Abdomen is soft.  Musculoskeletal:        General: Normal range of motion.     Cervical back: Normal range of motion and neck supple.  Skin:    General: Skin is warm.     Coloration: Skin is jaundiced.  Neurological:     General: No focal deficit present.     Mental Status: He is alert and oriented to person, place, and time.  Psychiatric:        Mood and Affect: Mood normal.        Behavior: Behavior normal.      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  I also discussed complications associated and treatment strategies as well.  -Etiology of patient's decompensated cirrhosis likely due to alcohol.  We will perform full serological work-up today to rule out other causes. -Check meld labs today -Patient does have abdominal distention on exam, I will order another paracentesis.   -We will also start him on diuretic therapy with Lasix 40 mg daily and spironolactone 100 mg daily.  Patient told to hold his lisinopril/HCTZ for now.  He needs to watch blood pressure as well as weight at home.  Check renal functions and electrolytes in 2-3 weeks. -I counseled patient and daughter on the vast importance of a low-sodium diet, less than 2000 mg daily.  Also counseled on ensuring that patient gets enough protein in his diet to reduce risk of muscle wasting. -Patient does have chronic fatigue and intermittent confusion likely due to hepatic encephalopathy.  I am going to start him on lactulose as well as Xifaxan.  Counseled patient on titrating up his lactulose to ensure at least 2-3 loose bowel movements daily. -We will schedule patient for EGD for variceal screening today in clinic. -Ultrasound 12/06/2019 without evidence of HCC.  Repeat in 6 months.  Check AFP with blood work today.  Discussed long-term prognosis of decompensated cirrhosis with patient and daughter today.  He has elevated risk of mortality given decompensated nature of his liver disease.  I did counsel on the vast importance of alcohol cessation as we do see some regression in alcoholic induced cirrhosis with sobriety.  Discussed liver transplant with patient and daughter today.  I did note the  patient has multiple obstacles to this including age, recent alcohol use, and comorbidities.  We will work to  get complications from cirrhosis under better control.  Depending on his meld score, I will likely refer him to hepatology for initial consultation in the near future.  Patient to follow-up in 3 weeks or sooner if needed.   12/15/2019 1:09 PM   Disclaimer: This note was dictated with voice recognition software. Similar sounding words can inadvertently be transcribed and may not be corrected upon review.

## 2019-12-15 NOTE — Telephone Encounter (Signed)
Called pt and spoke with daughter crystal. Aware Para scheduled for 12/3 at 10:00am, arrival 9:45am. EGD scheduled for 12/28 at 2:15pm. Aware will need pre-op/covid test appt prior to procedure. Advised will mail instructions with this appt.

## 2019-12-15 NOTE — Patient Instructions (Signed)
We will order blood work to calculate your meld score which can help evaluate how advanced your cirrhosis is.  I am also going to check for other underlying causes of your cirrhosis to make sure we are not missing anything.  For your fluid overload I am going to start you on furosemide 40 mg and spironolactone 100 mg daily.  Take these in the morning as if you take it at night you will be up all night urinating.  Watch your blood pressure at home as well as your weight (you will likely lose a few lbs of water weight).  If you have severe dizziness please call the office and let us know.  Hold your lisinopril/HCTZ for now.  I am going to order paracentesis to have you abdomen drained.   For hepatic encephalopathy (confusion/fatigue) I am going to start you on lactulose.  The prescription will say 20 g 3 times daily.  The goal is to have 2-3 loose bowel movements a day so titrate this up or down as needed.  I am also going to start you on rifaximin (Xifaxan).  Hopefully your insurance covers this, if not we will do our best to proceed with prior authorization and get it covered.  The lactulose should help.  We will schedule you for EGD to evaluate for esophageal varices.  Follow-up with me in 3 weeks.  We will need to check kidney function at that time given the diuretic therapy we are starting you on today.  At [facility] we value your feedback. You may receive a survey about your visit today. Please share your experience as we strive to create trusting relationships with our patients to provide genuine, compassionate, quality care.  We appreciate your understanding and patience as we review any laboratory studies, imaging, and other diagnostic tests that are ordered as we care for you. Our office policy is 5 business days for review of these results, and any emergent or urgent results are addressed in a timely manner for your best interest. If you do not hear from our office in 1 week,  please contact us.   We also encourage the use of MyChart, which contains your medical information for your review as well. If you are not enrolled in this feature, an access code is on this after visit summary for your convenience. Thank you for allowing [place] to be involved in your care.  It was great to see you today!  I hope you have a great rest of your winter!!    Elon Alas. Abbey Chatters, D.O. Gastroenterology and Hepatology [facility]

## 2019-12-16 ENCOUNTER — Encounter (HOSPITAL_COMMUNITY): Payer: Self-pay

## 2019-12-16 ENCOUNTER — Ambulatory Visit (HOSPITAL_COMMUNITY)
Admission: RE | Admit: 2019-12-16 | Discharge: 2019-12-16 | Disposition: A | Discharge status: Home / Self Care | Payer: No Typology Code available for payment source | Source: Ambulatory Visit | Attending: Internal Medicine | Admitting: Internal Medicine

## 2019-12-16 ENCOUNTER — Encounter: Payer: Self-pay | Admitting: *Deleted

## 2019-12-16 DIAGNOSIS — K7031 Alcoholic cirrhosis of liver with ascites: Secondary | ICD-10-CM | POA: Diagnosis not present

## 2019-12-16 LAB — BODY FLUID CELL COUNT WITH DIFFERENTIAL
Eos, Fluid: 0 %
Lymphs, Fluid: 50 %
Monocyte-Macrophage-Serous Fluid: 14 % — ABNORMAL LOW (ref 50–90)
Neutrophil Count, Fluid: 36 % — ABNORMAL HIGH (ref 0–25)
Total Nucleated Cell Count, Fluid: 170 cu mm (ref 0–1000)

## 2019-12-16 LAB — GRAM STAIN

## 2019-12-16 MED ORDER — ALBUMIN HUMAN 25 % IV SOLN: 0.0000 g | Freq: Once | INTRAVENOUS | Status: DC

## 2019-12-16 NOTE — Discharge Instructions (Signed)
Paracentesis, Care After °This sheet gives you information about how to care for yourself after your procedure. Your health care provider may also give you more specific instructions. If you have problems or questions, contact your health care provider. °What can I expect after the procedure? °After the procedure, it is common to have a small amount of clear fluid coming from the puncture site. °Follow these instructions at home: °Puncture site care ° °· Follow instructions from your health care provider about how to take care of your puncture site. Make sure you: °? Wash your hands with soap and water before and after you change your bandage (dressing). If soap and water are not available, use hand sanitizer. °? Change your dressing as told by your health care provider. °· Check your puncture area every day for signs of infection. Check for: °? Redness, swelling, or pain. °? More fluid or blood. °? Warmth. °? Pus or a bad smell. °General instructions °· Return to your normal activities as told by your health care provider. Ask your health care provider what activities are safe for you. °· Take over-the-counter and prescription medicines only as told by your health care provider. °· Do not take baths, swim, or use a hot tub until your health care provider approves. Ask your health care provider if you may take showers. You may only be allowed to take sponge baths. °· Keep all follow-up visits as told by your health care provider. This is important. °Contact a health care provider if: °· You have redness, swelling, or pain at your puncture site. °· You have more fluid or blood coming from your puncture site. °· Your puncture site feels warm to the touch. °· You have pus or a bad smell coming from your puncture site. °· You have a fever. °Get help right away if: °· You have chest pain or shortness of breath. °· You develop increasing pain, discomfort, or swelling in your abdomen. °· You feel dizzy or light-headed or  you faint. °Summary °· After the procedure, it is common to have a small amount of clear fluid coming from the puncture site. °· Follow instructions from your health care provider about how to take care of your puncture site. °· Check your puncture area every day signs of infection. °· Keep all follow-up visits as told by your health care provider. °This information is not intended to replace advice given to you by your health care provider. Make sure you discuss any questions you have with your health care provider. °Document Revised: 07/13/2018 Document Reviewed: 10/20/2017 °Elsevier Patient Education © 2020 [employer] ° °

## 2019-12-16 NOTE — Sedation Documentation (Signed)
PT tolerated right sided paracentesis well today and 4.8 L of clear yellow fluid removed and labs sent for processing. PT verbalized understanding of d/c instructions.

## 2019-12-16 NOTE — Procedures (Signed)
PreOperative Dx: Alcoholic cirrhosis, ascites Postoperative Dx: Alcoholic cirrhosis, ascites Procedure:   US guided paracentesis Radiologist:  Thornton Papas Anesthesia:  10 ml of1% lidocaine Specimen:  4.8 L of yellow ascitic fluid EBL:   < 1 ml Complications: None

## 2019-12-19 ENCOUNTER — Telehealth: Payer: Self-pay | Admitting: Internal Medicine

## 2019-12-19 LAB — IMMUNOGLOBULINS A/E/G/M, SERUM
IgA/Immunoglobulin A, Serum: 646 mg/dL — ABNORMAL HIGH (ref 61–437)
IgE (Immunoglobulin E), Serum: 358 IU/mL (ref 6–495)
IgG (Immunoglobin G), Serum: 1922 mg/dL — ABNORMAL HIGH (ref 603–1613)
IgM (Immunoglobulin M), Srm: 140 mg/dL (ref 15–143)

## 2019-12-19 LAB — ANA: ANA Titer 1: NEGATIVE

## 2019-12-19 LAB — PATHOLOGIST SMEAR REVIEW

## 2019-12-19 NOTE — Telephone Encounter (Signed)
Phoned and spoke with the pt's daughter and she has several questions for you regarding her father. #1.Pt needs his Rx's written out and sent with medical documentation why the pt is needing the Rx. It needs to be faxed to [facility] @ [phone]. This also serves as a prior authorization for this pt. #2.The Dr who done pt's procedure last Friday (paracentesis) wants the request made to have multiplies of the procedures lined up so the pt can get them when needed and also how does this work. #3 she wants follow-up on the pts MELD score. #4. Also is it more useful for her to communicate with you through mychart. This pt's daughter would like to speak with you.

## 2019-12-19 NOTE — Telephone Encounter (Signed)
Please call patient's daughter, [name], regarding his prescriptions being sent to the [place]. [phone]

## 2019-12-21 ENCOUNTER — Telehealth: Payer: Self-pay | Admitting: Internal Medicine

## 2019-12-21 ENCOUNTER — Telehealth: Payer: Self-pay | Admitting: Hematology

## 2019-12-21 ENCOUNTER — Inpatient Hospital Stay: Payer: Non-veteran care | Admitting: Hematology

## 2019-12-21 DIAGNOSIS — R1084 Generalized abdominal pain: Secondary | ICD-10-CM

## 2019-12-21 MED ORDER — LACTULOSE 10 GM/15ML PO SOLN: 20.0000 g | Freq: Three times a day (TID) | ORAL | 5 refills | Status: DC

## 2019-12-21 MED ORDER — FUROSEMIDE 40 MG PO TABS: 40.0000 mg | ORAL_TABLET | Freq: Every day | ORAL | 5 refills | Status: DC

## 2019-12-21 MED ORDER — SPIRONOLACTONE 100 MG PO TABS: 100.0000 mg | ORAL_TABLET | Freq: Every day | ORAL | 5 refills | Status: DC

## 2019-12-21 MED ORDER — RIFAXIMIN 550 MG PO TABS: 550.0000 mg | ORAL_TABLET | Freq: Two times a day (BID) | ORAL | 5 refills | Status: DC

## 2019-12-21 NOTE — Telephone Encounter (Signed)
Spoke with Marcus Pham. Patient complaining of abdominal discomfort and urge for BM though nothing coming out despite multiple doses of lactulose. Will order KUB to further evaluate. Also discussed that patient needs to have blood work performed.

## 2019-12-21 NOTE — Telephone Encounter (Signed)
Noted  2. Put together pt's Rx's along with the office note Dr. Abbey Chatters provided to send to [facility] to be faxed.

## 2019-12-21 NOTE — Addendum Note (Signed)
Addended by: Eloise Harman on: 12/21/2019 09:06 AM   Modules accepted: Orders

## 2019-12-21 NOTE — Progress Notes (Incomplete)
HEMATOLOGY/ONCOLOGY CONSULTATION NOTE  Date of Service: 12/21/2019  Patient Care Team: Thornton Papas, PA-C as PCP - General (Physician Assistant)  CHIEF COMPLAINTS/PURPOSE OF CONSULTATION:  Thrombocytopenia  HISTORY OF PRESENTING ILLNESS:  Marcus Pham is a wonderful 70 y.o. male who has been referred to [place] by Dr Merryl Hacker for evaluation and management of thrombocytopenia. Pt is accompanied today by ***. The pt reports that he is doing well overall.   The pt reports ***   Of note prior to the patient's visit today, pt has had *** completed on *** with results revealing ***.   Most recent lab results (***) of CBC is as follows: all values are WNL except for ***.  On review of systems, pt reports *** and denies ***and any other symptoms.   On PMHx the pt reports ***. On Social Hx the pt reports *** On Family Hx the pt reports ***  A: -Discussed patient's most recent labs from ***, *** -***  MEDICAL HISTORY:  Past Medical History:  Diagnosis Date  . Anxiety   . Arthritis    back and hands  . Depression    PTSD  . GERD (gastroesophageal reflux disease)   . Hyperlipidemia   . Hypertension   . Substance abuse ([place])    patient denies but says he drinks up to 12 beers a day  . Thyroid disease     SURGICAL HISTORY: Past Surgical History:  Procedure Laterality Date  . 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 ROTATOR CUFF REPAIR Left    2002-2003?  . TONSILLECTOMY     as a child    SOCIAL HISTORY: Social History   Socioeconomic History  . Marital status: Married    Spouse name: Not on file  . Number of children: Not on file  . Years of education: Not on file  . Highest education level: Not on file  Occupational History  . Not on file  Tobacco Use  . Smoking status: Former Research scientist (life sciences)  . Smokeless tobacco: Never Used  . Tobacco comment: quit 40 years ago   Substance and Sexual Activity  . Alcohol use: Not Currently    Alcohol/week: 4.0 - 5.0 standard  drinks    Types: 4 - 5 Cans of beer per week  . Drug use: Not Currently    Types: Marijuana    Comment: occasional marijuana use  . Sexual activity: Yes  Other Topics Concern  . Not on file  Social History Narrative  . Not on file   Social Determinants of Health   Financial Resource Strain:   . Difficulty of Paying Living Expenses: Not on file  Food Insecurity:   . Worried About Charity fundraiser in the Last Year: Not on file  . Ran Out of Food in the Last Year: Not on file  Transportation Needs:   . Lack of Transportation (Medical): Not on file  . Lack of Transportation (Non-Medical): Not on file  Physical Activity:   . Days of Exercise per Week: Not on file  . Minutes of Exercise per Session: Not on file  Stress:   . Feeling of Stress : Not on file  Social Connections:   . Frequency of Communication with Friends and Family: Not on file  . Frequency of Social Gatherings with Friends and Family: Not on file  . Attends Religious Services: Not on file  . Active Member of Clubs or Organizations: Not on file  . Attends Club or  Organization Meetings: Not on file  . Marital Status: Not on file  Intimate Partner Violence:   . Fear of Current or Ex-Partner: Not on file  . Emotionally Abused: Not on file  . Physically Abused: Not on file  . Sexually Abused: Not on file    FAMILY HISTORY: Family History  Problem Relation Age of Onset  . Hypertension Mother   . Arthritis Mother   . Heart disease Father 52       CHF  . Alcohol abuse Brother        substance abuse, died of trauma  . Heart disease Paternal Grandfather 18       MI    ALLERGIES:  has No Known Allergies.  MEDICATIONS:  Current Outpatient Medications  Medication Sig Dispense Refill  . acetaminophen-codeine (TYLENOL #3) 300-30 MG tablet  (Patient not taking: Reported on 10/19/2019)    . atorvastatin (LIPITOR) 10 MG tablet  (Patient not taking: Reported on 10/19/2019)    . furosemide (LASIX) 40 MG tablet Take  1 tablet (40 mg total) by mouth daily. 30 tablet 5  . gabapentin (NEURONTIN) 100 MG capsule Take 100 mg by mouth 3 (three) times daily. (Patient not taking: Reported on 10/19/2019)    . ibuprofen (ADVIL,MOTRIN) 800 MG tablet Take 800 mg by mouth as needed.     . lactulose (CHRONULAC) 10 GM/15ML solution Take 30 mLs (20 g total) by mouth 3 (three) times daily. Titrate up to 2-3 loose BMs daily 2700 mL 5  . levothyroxine (SYNTHROID, LEVOTHROID) 25 MCG tablet Take 25 mcg by mouth daily before breakfast. (Patient not taking: Reported on 10/19/2019)    . methocarbamol (ROBAXIN) 750 MG tablet Take 750 mg by mouth as needed.     Marland Kitchen omeprazole (PRILOSEC) 20 MG capsule 20 mg as needed.     . rifaximin (XIFAXAN) 550 MG TABS tablet Take 1 tablet (550 mg total) by mouth 2 (two) times daily. 60 tablet 5  . sertraline (ZOLOFT) 100 MG tablet Take 100 mg by mouth daily. (Patient not taking: Reported on 10/19/2019)    . spironolactone (ALDACTONE) 100 MG tablet Take 1 tablet (100 mg total) by mouth daily. 30 tablet 5  . triamcinolone cream (KENALOG) 0.1 % Apply 1 application topically 2 (two) times daily. (Patient not taking: Reported on 10/19/2019)     No current facility-administered medications for this visit.    REVIEW OF SYSTEMS:    10 Point review of Systems was done is negative except as noted above.  PHYSICAL EXAMINATION: ECOG PERFORMANCE STATUS: {CHL ONC ECOG CB:[id number]}  .There were no vitals filed for this visit. There were no vitals filed for this visit. .There is no height or weight on file to calculate BMI.  *** GENERAL:alert, in no acute distress and comfortable SKIN: no acute rashes, no significant lesions EYES: conjunctiva are pink and non-injected, sclera anicteric OROPHARYNX: MMM, no exudates, no oropharyngeal erythema or ulceration NECK: supple, no JVD LYMPH:  no palpable lymphadenopathy in the cervical, axillary or inguinal regions LUNGS: clear to auscultation b/l with normal  respiratory effort HEART: regular rate & rhythm ABDOMEN:  normoactive bowel sounds , non tender, not distended. Extremity: no pedal edema PSYCH: alert & oriented x 3 with fluent speech NEURO: no focal motor/sensory deficits  LABORATORY DATA:  I have reviewed the data as listed  .No flowsheet data found.  .No flowsheet data found.   RADIOGRAPHIC STUDIES: I have personally reviewed the radiological images as listed and agreed with the findings in  the report. US Paracentesis  Result Date: 12/16/2019 INDICATION: Alcoholic cirrhosis, ascites EXAM: ULTRASOUND GUIDED DIAGNOSTIC AND THERAPEUTIC PARACENTESIS MEDICATIONS: None COMPLICATIONS: None immediate PROCEDURE: Informed written consent was obtained from the patient after a discussion of the risks, benefits and alternatives to treatment. A timeout was performed prior to the initiation of the procedure. Initial ultrasound scanning demonstrates a large amount of ascites within the right lower abdominal quadrant. The right lower abdomen was prepped and draped in the usual sterile fashion. 1% lidocaine was used for local anesthesia. Following this, a 5 [place] Yueh catheter was introduced. An ultrasound image was saved for documentation purposes. The paracentesis was performed. The catheter was removed and a dressing was applied. The patient tolerated the procedure well without immediate post procedural complication. Patient received post-procedure intravenous albumin; see nursing notes for details. FINDINGS: A total of approximately 4.8 L of yellow ascitic fluid was removed. Sample sent to laboratory for requested analysis. IMPRESSION: Successful ultrasound-guided paracentesis yielding 4.8 liters of peritoneal fluid. Electronically Signed   By: Lavonia Dana M.D.   On: 12/16/2019 13:18   US Paracentesis  Result Date: 12/07/2019 INDICATION: Cirrhosis, ascites EXAM: ULTRASOUND GUIDED DIAGNOSTIC AND THERAPEUTIC PARACENTESIS MEDICATIONS: None COMPLICATIONS:  None immediate PROCEDURE: Informed written consent was obtained from the patient after a discussion of the risks, benefits and alternatives to treatment. A timeout was performed prior to the initiation of the procedure. Initial ultrasound scanning demonstrates a large amount of ascites within the right lower abdominal quadrant. The right lower abdomen was prepped and draped in the usual sterile fashion. 1% lidocaine was used for local anesthesia. Following this, a 5 [place] Yueh catheter was introduced. An ultrasound image was saved for documentation purposes. The paracentesis was performed. The catheter was removed and a dressing was applied. The patient tolerated the procedure well without immediate post procedural complication. FINDINGS: A total of approximately 4 L of yellow ascitic fluid was removed. Samples were sent to the laboratory as requested by the clinical team. IMPRESSION: Successful ultrasound-guided paracentesis yielding 4 liters of peritoneal fluid. Electronically Signed   By: Lavonia Dana M.D.   On: 12/07/2019 15:03   [place] ELASTOGRAPHY LIVER  Result Date: 12/07/2019 CLINICAL DATA:  Cirrhosis EXAM: US LIVER ELASTOGRAPHY TECHNIQUE: Sonography of the liver was performed. In addition, ultrasound elastography evaluation of the liver was performed. A region of interest was placed within the right lobe of the liver. Following application of a compressive sonographic pulse, tissue compressibility was assessed. Multiple assessments were performed at the selected site. Median tissue compressibility was determined. Previously, hepatic stiffness was assessed by shear wave velocity. Based on recently published Society of Radiologists in Ultrasound consensus article, reporting is now recommended to be performed in the SI units of pressure (kiloPascals) representing hepatic stiffness/elasticity. The obtained result is compared to the published reference standards. (cACLD = compensated Advanced Chronic Liver  Disease) COMPARISON:  Ultrasound abdomen 12/06/2019 FINDINGS: Liver: Imaged 1 day ago, appeared grossly nodular and cirrhotic by ultrasound. Portal vein is patent on color Doppler imaging with normal direction of blood flow towards the liver. Incidentally noted cholelithiasis. ULTRASOUND HEPATIC ELASTOGRAPHY Device: Siemens Helix VTQ Patient position: Oblique Transducer: 5C1 Number of measurements: 10 Hepatic segment:  8 Median kPa: 37.8 IQR: 9.4 IQR/Median kPa ratio: 0.2 Data quality:  Good Limitations of study: Persistent ascites despite paracentesis with removal of 4 L of fluid, maximum withdrawal for first paracentesis. Diagnostic category: > or =17 kPa: highly suggestive of cACLD with an increased probability of clinically significant portal hypertension  The use of hepatic elastography is applicable to patients with viral hepatitis and non-alcoholic fatty liver disease. At this time, there is insufficient data for the referenced cut-off values and use in other causes of liver disease, including alcoholic liver disease. Patients, however, may be assessed by elastography and serve as their own reference standard/baseline. In patients with non-alcoholic liver disease, the values suggesting compensated advanced chronic liver disease (cACLD) may be lower, and patients may need additional testing with elasticity results of 7-9 kPa. Please note that abnormal hepatic elasticity and shear wave velocities may also be identified in clinical settings other than with hepatic fibrosis, such as: acute hepatitis, elevated right heart and central venous pressures including use of beta blockers, veno-occlusive disease (Budd-Chiari), infiltrative processes such as mastocytosis/amyloidosis/infiltrative tumor/lymphoma, extrahepatic cholestasis, with hyperemia in the post-prandial state, and with liver transplantation. Correlation with patient history, laboratory data, and clinical condition recommended. Diagnostic Categories: < or  =5 kPa: high probability of being normal < or =9 kPa: in the absence of other known clinical signs, rules out cACLD >9 kPa and ?13 kPa: suggestive of cACLD, but needs further testing >13 kPa: highly suggestive of cACLD > or =17 kPa: highly suggestive of cACLD with an increased probability of clinically significant portal hypertension IMPRESSION: ULTRASOUND LIVER: Grossly cirrhotic liver. Cholelithiasis. ULTRASOUND HEPATIC ELASTOGRAPHY: Median kPa:  37.7 Diagnostic category: > or =17 kPa: highly suggestive of cACLD with an increased probability of clinically significant portal hypertension Note: Please note that exam is limited by presence of ascites despite preceding paracentesis and removal of 4 L of fluid. Electronically Signed   By: Lavonia Dana M.D.   On: 12/07/2019 15:40   US Abdomen Limited RUQ (LIVER/GB)  Result Date: 12/06/2019 CLINICAL DATA:  RIGHT upper quadrant pain for 3 months, question cirrhosis, history fatty liver, alcohol use, elevated LFTs, hyperlipidemia, hypertension, former smoker EXAM: ULTRASOUND ABDOMEN LIMITED RIGHT UPPER QUADRANT COMPARISON:  08/03/2018 FINDINGS: Gallbladder: Gallbladder filled with shadowing calculi creating a wall echo shadow complex. Unable to accurately assess gallbladder wall thickness. No sonographic Murphy sign. Common bile duct: Diameter: 2 mm Liver: Nodular hepatic margins consistent with cirrhosis. Increased hepatic echogenicity. No discrete hepatic mass visualized. Patent with normal direction of blood flow towards the liver. Other: Significant ascites in RIGHT upper quadrant. IMPRESSION: Cirrhotic appearing liver with significant ascites. Cholelithiasis, with multiple calculi completely filling gallbladder. Due to presence of significant ascites, unable to perform at an accurate elastography exam at this time. Electronically Signed   By: Lavonia Dana M.D.   On: 12/06/2019 10:49    ASSESSMENT & PLAN:   PLAN: ***   FOLLOW UP: ***   All of the  patients questions were answered with apparent satisfaction. The patient knows to call the clinic with any problems, questions or concerns.  I spent *** counseling the patient face to face. The total time spent in the appointment was *** and more than 50% was on counseling and direct patient cares.    Sullivan Lone MD [place] AAHIVMS [facility] Hematology/Oncology Physician [facility]  (Office):       [phone] (Work cell):  [phone] (Fax):           [phone]  12/21/2019 7:35 AM  I, Yevette Edwards, am acting as a scribe for Dr. Sullivan Lone.   {Add Barista Statement}

## 2019-12-21 NOTE — Telephone Encounter (Signed)
Received a call from the pt's daughter to reschedule his new hem appt due to illness. Mr. Gucciardo has been rescheduled to see Dr. Irene Limbo on 1/4 at 10am.

## 2019-12-21 NOTE — Telephone Encounter (Signed)
Faxed pt's package to [facility].

## 2019-12-22 LAB — CULTURE, BODY FLUID W GRAM STAIN -BOTTLE: Culture: NO GROWTH

## 2019-12-23 LAB — COMPLETE METABOLIC PANEL WITH GFR
AG Ratio: 0.8 (calc) — ABNORMAL LOW (ref 1.0–2.5)
ALT: 20 U/L (ref 9–46)
AST: 37 U/L — ABNORMAL HIGH (ref 10–35)
Albumin: 2.8 g/dL — ABNORMAL LOW (ref 3.6–5.1)
Alkaline phosphatase (APISO): 122 U/L (ref 35–144)
BUN: 18 mg/dL (ref 7–25)
CO2: 27 mmol/L (ref 20–32)
Calcium: 8.5 mg/dL — ABNORMAL LOW (ref 8.6–10.3)
Chloride: 101 mmol/L (ref 98–110)
Creat: 1.04 mg/dL (ref 0.70–1.18)
GFR, Est African American: 83 mL/min/{1.73_m2} (ref >=60)
GFR, Est Non African American: 72 mL/min/{1.73_m2} (ref >=60)
Globulin: 3.6 g/dL (calc) (ref 1.9–3.7)
Glucose, Bld: 127 mg/dL (ref 65–139)
Potassium: 3.9 mmol/L (ref 3.5–5.3)
Sodium: 137 mmol/L (ref 135–146)
Total Bilirubin: 4 mg/dL — ABNORMAL HIGH (ref 0.2–1.2)
Total Protein: 6.4 g/dL (ref 6.1–8.1)

## 2019-12-23 LAB — PROTIME-INR
INR: 1.3 — ABNORMAL HIGH
Prothrombin Time: 14 s — ABNORMAL HIGH (ref 9.0–11.5)

## 2019-12-23 LAB — ANTI-SMOOTH MUSCLE ANTIBODY, IGG: Actin (Smooth Muscle) Antibody (IGG): <20 U (ref <20)

## 2019-12-23 LAB — ALPHA-1-ANTITRYPSIN: A-1 Antitrypsin, Ser: 192 mg/dL (ref 83–199)

## 2019-12-23 LAB — AMMONIA: Ammonia: 168 umol/L — ABNORMAL HIGH (ref <=72)

## 2019-12-23 LAB — HEPATITIS C ANTIBODY
Hepatitis C Ab: NONREACTIVE
SIGNAL TO CUT-OFF: 0.04 (ref <1.00)

## 2019-12-23 LAB — AFP TUMOR MARKER: AFP-Tumor Marker: 9.1 ng/mL — ABNORMAL HIGH (ref <6.1)

## 2019-12-23 LAB — MITOCHONDRIAL ANTIBODIES: Mitochondrial M2 Ab, IgG: <=20 U

## 2019-12-23 LAB — HEPATITIS B SURFACE ANTIGEN: Hepatitis B Surface Ag: NONREACTIVE

## 2019-12-23 LAB — HEPATITIS B CORE ANTIBODY, TOTAL: Hep B Core Total Ab: NONREACTIVE

## 2019-12-23 LAB — HEPATITIS B SURFACE ANTIBODY,QUALITATIVE: Hep B S Ab: NONREACTIVE

## 2019-12-23 LAB — FERRITIN: Ferritin: 345 ng/mL (ref 24–380)

## 2019-12-27 ENCOUNTER — Telehealth: Payer: Self-pay | Admitting: Internal Medicine

## 2019-12-27 NOTE — Telephone Encounter (Signed)
Spoke with pts daughter. Pt was able to complete labs and they have resulted. Pt is inquiring about lab results and meld score.

## 2019-12-27 NOTE — Telephone Encounter (Signed)
Pt's daughter, [name], called to speak with nurse about patient. She also has questions about his medication and if it was sent to the [place]. [phone]

## 2019-12-28 ENCOUNTER — Telehealth: Payer: Self-pay | Admitting: Internal Medicine

## 2019-12-28 NOTE — Telephone Encounter (Signed)
Pt's daughter called again today asking to speak with nurse. I transferred call to VM. [phone]

## 2019-12-28 NOTE — Telephone Encounter (Signed)
Spoke with pts daughter. Phone note was sent to Dr. Abbey Chatters. Waiting on a response. Dr. Abbey Chatters, see previous phone note.

## 2019-12-30 ENCOUNTER — Telehealth: Payer: Self-pay | Admitting: Internal Medicine

## 2019-12-30 NOTE — Telephone Encounter (Signed)
Called and spoke with patient today. Thank you

## 2019-12-30 NOTE — Telephone Encounter (Signed)
Patient needs to be seen earlier than currently scheduled.  It appears I have half day on January 5.  Can we add him at noon and call patient's daughter to schedule this?  Thank you

## 2020-01-02 NOTE — Telephone Encounter (Signed)
Noted  

## 2020-01-02 NOTE — Telephone Encounter (Signed)
Patient on for Jan 5 at 5

## 2020-01-02 NOTE — Telephone Encounter (Signed)
1.Phoned and spoke with the pts daughter and she agrees to bring her father in on Janurary 5th at 12 noon. States she had spoke with Dr. Abbey Chatters and they had agreed to do that but she is seeing some improvement in her father moving the right way.  2. Erline Levine can you please put this pt on for 12 noon January 5, at 12 noon.

## 2020-01-03 NOTE — Patient Instructions (Signed)
KENDRIX ORMAN  01/03/2020     @PREFPERIOPPHARMACY @   Your procedure is scheduled on 01/10/2020.  Report to [facility] at  1200  P.M.  Call this number if you have problems the morning of surgery:  [phone]   Remember:  Follow the diet instructions given to you by the office.                       Take these medicines the morning of surgery with A SIP OF WATER  Rovaxin, prilosec.    Do not wear jewelry, make-up or nail polish.  Do not wear lotions, powders, or perfumes. Please wear deodorant and brush your teeth.  Do not shave 48 hours prior to surgery.  Men may shave face and neck.  Do not bring valuables to the hospital.  [facility] is not responsible for any belongings or valuables.  Contacts, dentures or bridgework may not be worn into surgery.  Leave your suitcase in the car.  After surgery it may be brought to your room.  For patients admitted to the hospital, discharge time will be determined by your treatment team.  Patients discharged the day of surgery will not be allowed to drive home.   Name and phone number of your driver:   family Special instructions:  DO NOT smoke the mornnig of your procedure.  Please read over the following fact sheets that you were given. Anesthesia Post-op Instructions and Care and Recovery After Surgery       Upper Endoscopy, Adult, Care After This sheet gives you information about how to care for yourself after your procedure. Your health care provider may also give you more specific instructions. If you have problems or questions, contact your health care provider. What can I expect after the procedure? After the procedure, it is common to have:  A sore throat.  Mild stomach pain or discomfort.  Bloating.  Nausea. Follow these instructions at home:   Follow instructions from your health care provider about what to eat or drink after your procedure.  Return to your normal activities as told by your  health care provider. Ask your health care provider what activities are safe for you.  Take over-the-counter and prescription medicines only as told by your health care provider.  Do not drive for 24 hours if you were given a sedative during your procedure.  Keep all follow-up visits as told by your health care provider. This is important. Contact a health care provider if you have:  A sore throat that lasts longer than one day.  Trouble swallowing. Get help right away if:  You vomit blood or your vomit looks like coffee grounds.  You have: ? A fever. ? Bloody, black, or tarry stools. ? A severe sore throat or you cannot swallow. ? Difficulty breathing. ? Severe pain in your chest or abdomen. Summary  After the procedure, it is common to have a sore throat, mild stomach discomfort, bloating, and nausea.  Do not drive for 24 hours if you were given a sedative during the procedure.  Follow instructions from your health care provider about what to eat or drink after your procedure.  Return to your normal activities as told by your health care provider. This information is not intended to replace advice given to you by your health care provider. Make sure you discuss any questions you have with your health care provider. Document Revised: 06/23/2017 Document Reviewed: 06/01/2017  Elsevier Patient Education  2020 [place] After These instructions provide you with information about caring for yourself after your procedure. Your health care provider may also give you more specific instructions. Your treatment has been planned according to current medical practices, but problems sometimes occur. Call your health care provider if you have any problems or questions after your procedure. What can I expect after the procedure? After your procedure, you may:  Feel sleepy for several hours.  Feel clumsy and have poor balance for several hours.  Feel  forgetful about what happened after the procedure.  Have poor judgment for several hours.  Feel nauseous or vomit.  Have a sore throat if you had a breathing tube during the procedure. Follow these instructions at home: For at least 24 hours after the procedure:      Have a responsible adult stay with you. It is important to have someone help care for you until you are awake and alert.  Rest as needed.  Do not: ? Participate in activities in which you could fall or become injured. ? Drive. ? Use heavy machinery. ? Drink alcohol. ? Take sleeping pills or medicines that cause drowsiness. ? Make important decisions or sign legal documents. ? Take care of children on your own. Eating and drinking  Follow the diet that is recommended by your health care provider.  If you vomit, drink water, juice, or soup when you can drink without vomiting.  Make sure you have little or no nausea before eating solid foods. General instructions  Take over-the-counter and prescription medicines only as told by your health care provider.  If you have sleep apnea, surgery and certain medicines can increase your risk for breathing problems. Follow instructions from your health care provider about wearing your sleep device: ? Anytime you are sleeping, including during daytime naps. ? While taking prescription pain medicines, sleeping medicines, or medicines that make you drowsy.  If you smoke, do not smoke without supervision.  Keep all follow-up visits as told by your health care provider. This is important. Contact a health care provider if:  You keep feeling nauseous or you keep vomiting.  You feel light-headed.  You develop a rash.  You have a fever. Get help right away if:  You have trouble breathing. Summary  For several hours after your procedure, you may feel sleepy and have poor judgment.  Have a responsible adult stay with you for at least 24 hours or until you are awake and  alert. This information is not intended to replace advice given to you by your health care provider. Make sure you discuss any questions you have with your health care provider. Document Revised: 03/30/2017 Document Reviewed: 04/22/2015 Elsevier Patient Education  [place].

## 2020-01-04 ENCOUNTER — Encounter (HOSPITAL_COMMUNITY): Payer: Self-pay

## 2020-01-04 ENCOUNTER — Encounter (HOSPITAL_COMMUNITY)
Admission: RE | Admit: 2020-01-04 | Discharge: 2020-01-04 | Disposition: A | Discharge status: Home / Self Care | Payer: No Typology Code available for payment source | Source: Ambulatory Visit | Attending: Internal Medicine | Admitting: Internal Medicine

## 2020-01-04 ENCOUNTER — Other Ambulatory Visit: Payer: Self-pay

## 2020-01-04 DIAGNOSIS — Z01810 Encounter for preprocedural cardiovascular examination: Secondary | ICD-10-CM | POA: Insufficient documentation

## 2020-01-10 ENCOUNTER — Other Ambulatory Visit (HOSPITAL_COMMUNITY)
Admission: RE | Admit: 2020-01-10 | Discharge: 2020-01-10 | Disposition: A | Discharge status: Home / Self Care | Payer: No Typology Code available for payment source | Source: Ambulatory Visit | Attending: Internal Medicine | Admitting: Internal Medicine

## 2020-01-10 ENCOUNTER — Ambulatory Visit (HOSPITAL_COMMUNITY): Payer: No Typology Code available for payment source | Admitting: Anesthesiology

## 2020-01-10 ENCOUNTER — Encounter (HOSPITAL_COMMUNITY)
Admission: RE | Disposition: A | Discharge status: Home / Self Care | Payer: Self-pay | Source: Home / Self Care | Attending: Internal Medicine

## 2020-01-10 ENCOUNTER — Other Ambulatory Visit: Payer: Self-pay

## 2020-01-10 ENCOUNTER — Encounter (HOSPITAL_COMMUNITY): Payer: Self-pay

## 2020-01-10 ENCOUNTER — Ambulatory Visit (HOSPITAL_COMMUNITY)
Admission: RE | Admit: 2020-01-10 | Discharge: 2020-01-10 | Disposition: A | Discharge status: Home / Self Care | Payer: No Typology Code available for payment source | Attending: Internal Medicine | Admitting: Internal Medicine

## 2020-01-10 DIAGNOSIS — Z13810 Encounter for screening for upper gastrointestinal disorder: Secondary | ICD-10-CM | POA: Diagnosis not present

## 2020-01-10 DIAGNOSIS — K7460 Unspecified cirrhosis of liver: Secondary | ICD-10-CM | POA: Diagnosis not present

## 2020-01-10 DIAGNOSIS — Z01812 Encounter for preprocedural laboratory examination: Secondary | ICD-10-CM | POA: Insufficient documentation

## 2020-01-10 DIAGNOSIS — Z87891 Personal history of nicotine dependence: Secondary | ICD-10-CM | POA: Insufficient documentation

## 2020-01-10 DIAGNOSIS — K766 Portal hypertension: Secondary | ICD-10-CM | POA: Insufficient documentation

## 2020-01-10 DIAGNOSIS — Z20822 Contact with and (suspected) exposure to covid-19: Secondary | ICD-10-CM | POA: Insufficient documentation

## 2020-01-10 DIAGNOSIS — Z79899 Other long term (current) drug therapy: Secondary | ICD-10-CM | POA: Insufficient documentation

## 2020-01-10 DIAGNOSIS — K449 Diaphragmatic hernia without obstruction or gangrene: Secondary | ICD-10-CM | POA: Insufficient documentation

## 2020-01-10 DIAGNOSIS — Z79890 Hormone replacement therapy (postmenopausal): Secondary | ICD-10-CM | POA: Insufficient documentation

## 2020-01-10 DIAGNOSIS — K3189 Other diseases of stomach and duodenum: Secondary | ICD-10-CM | POA: Insufficient documentation

## 2020-01-10 HISTORY — PX: ESOPHAGOGASTRODUODENOSCOPY (EGD) WITH PROPOFOL: SHX5813

## 2020-01-10 LAB — SARS CORONAVIRUS 2 BY RT PCR (HOSPITAL ORDER, PERFORMED IN ~~LOC~~ HOSPITAL LAB): SARS Coronavirus 2: NEGATIVE

## 2020-01-10 SURGERY — ESOPHAGOGASTRODUODENOSCOPY (EGD) WITH PROPOFOL
Anesthesia: General

## 2020-01-10 MED ORDER — GLYCOPYRROLATE 0.2 MG/ML IJ SOLN
0.2000 mg | Freq: Once | INTRAMUSCULAR | Status: AC
  Administered 2020-01-10: 13:00:00 0.2 mg via INTRAVENOUS

## 2020-01-10 MED ORDER — PROPOFOL 500 MG/50ML IV EMUL
INTRAVENOUS | Status: DC | PRN
  Administered 2020-01-10: 150 ug/kg/min via INTRAVENOUS

## 2020-01-10 MED ORDER — STERILE WATER FOR IRRIGATION IR SOLN
Status: DC | PRN
  Administered 2020-01-10: 13:00:00 100 mL

## 2020-01-10 MED ORDER — PROPOFOL 10 MG/ML IV BOLUS
INTRAVENOUS | Status: DC | PRN
  Administered 2020-01-10: 50 mg via INTRAVENOUS

## 2020-01-10 MED ORDER — LACTATED RINGERS IV SOLN
INTRAVENOUS | Status: DC
  Administered 2020-01-10: 13:00:00 1000 mL via INTRAVENOUS

## 2020-01-10 MED ORDER — GLYCOPYRROLATE 0.2 MG/ML IJ SOLN
INTRAMUSCULAR | Status: AC
  Filled 2020-01-10: qty 1

## 2020-01-10 MED ORDER — LIDOCAINE VISCOUS HCL 2 % MT SOLN
15.0000 mL | Freq: Once | OROMUCOSAL | Status: AC
  Administered 2020-01-10: 13:00:00 15 mL via OROMUCOSAL

## 2020-01-10 MED ORDER — LIDOCAINE VISCOUS HCL 2 % MT SOLN
OROMUCOSAL | Status: AC
  Filled 2020-01-10: qty 15

## 2020-01-10 NOTE — Anesthesia Postprocedure Evaluation (Signed)
Anesthesia Post Note  Patient: Marcus Pham  Procedure(s) Performed: ESOPHAGOGASTRODUODENOSCOPY (EGD) WITH PROPOFOL (N/A )  Patient location during evaluation: PACU Anesthesia Type: General Level of consciousness: awake and alert Pain management: pain level controlled Vital Signs Assessment: post-procedure vital signs reviewed and stable Respiratory status: spontaneous breathing Cardiovascular status: blood pressure returned to baseline and stable Postop Assessment: no apparent nausea or vomiting Anesthetic complications: no   No complications documented.   Last Vitals:  Vitals:   01/10/20 1221 01/10/20 1330  BP: (!) 151/83 (!) 89/58  Pulse: 78 83  Resp: 19 20  Temp: 36.6 C   SpO2: 99% 98%    Last Pain:  Vitals:   01/10/20 1314  TempSrc:   PainSc: 0-No pain                 Ashlye Oviedo

## 2020-01-10 NOTE — Interval H&P Note (Signed)
History and Physical Interval Note:  01/10/2020 12:17 PM  Marcus Pham  has presented today for surgery, with the diagnosis of variceal screening.  The various methods of treatment have been discussed with the patient and family. After consideration of risks, benefits and other options for treatment, the patient has consented to  Procedure(s) with comments: ESOPHAGOGASTRODUODENOSCOPY (EGD) WITH PROPOFOL (N/A) - 2:15pm as a surgical intervention.  The patient's history has been reviewed, patient examined, no change in status, stable for surgery.  I have reviewed the patient's chart and labs.  Questions were answered to the patient's satisfaction.     Lanelle Bal

## 2020-01-10 NOTE — Anesthesia Preprocedure Evaluation (Signed)
Anesthesia Evaluation  Patient identified by MRN, date of birth, ID band Patient awake    Reviewed: Allergy & Precautions, H&P , NPO status , Patient's Chart, lab work & pertinent test results, reviewed documented beta blocker date and time   Airway Mallampati: II  TM Distance: >3 FB Neck ROM: full    Dental no notable dental hx.    Pulmonary neg pulmonary ROS, former smoker,    Pulmonary exam normal breath sounds clear to auscultation       Cardiovascular Exercise Tolerance: Good hypertension, negative cardio ROS   Rhythm:regular Rate:Normal     Neuro/Psych PSYCHIATRIC DISORDERS Anxiety Depression negative neurological ROS     GI/Hepatic GERD  Medicated,(+)     substance abuse  alcohol use,   Endo/Other  Hypothyroidism   Renal/GU negative Renal ROS  negative genitourinary   Musculoskeletal   Abdominal   Peds  Hematology negative hematology ROS (+)   Anesthesia Other Findings   Reproductive/Obstetrics negative OB ROS                             Anesthesia Physical Anesthesia Plan  ASA: III  Anesthesia Plan: General   Post-op Pain Management:    Induction:   PONV Risk Score and Plan: Propofol infusion  Airway Management Planned:   Additional Equipment:   Intra-op Plan:   Post-operative Plan:   Informed Consent: I have reviewed the patients History and Physical, chart, labs and discussed the procedure including the risks, benefits and alternatives for the proposed anesthesia with the patient or authorized representative who has indicated his/her understanding and acceptance.     Dental Advisory Given  Plan Discussed with: CRNA  Anesthesia Plan Comments:         Anesthesia Quick Evaluation

## 2020-01-10 NOTE — Transfer of Care (Signed)
Immediate Anesthesia Transfer of Care Note  Patient: Marcus Pham  Procedure(s) Performed: ESOPHAGOGASTRODUODENOSCOPY (EGD) WITH PROPOFOL (N/A )  Patient Location: PACU  Anesthesia Type:General  Level of Consciousness: awake  Airway & Oxygen Therapy: Patient Spontanous Breathing  Post-op Assessment: Report given to RN  Post vital signs: Reviewed  Last Vitals:  Vitals Value Taken Time  BP    Temp    Pulse    Resp    SpO2      Last Pain:  Vitals:   01/10/20 1314  TempSrc:   PainSc: 0-No pain      Patients Stated Pain Goal: 5 (01/10/20 1221)  Complications: No complications documented.

## 2020-01-10 NOTE — Discharge Instructions (Signed)
EGD Discharge instructions Please read the instructions outlined below and refer to this sheet in the next few weeks. These discharge instructions provide you with general information on caring for yourself after you leave the hospital. Your doctor may also give you specific instructions. While your treatment has been planned according to the most current medical practices available, unavoidable complications occasionally occur. If you have any problems or questions after discharge, please call your doctor. ACTIVITY  You may resume your regular activity but move at a slower pace for the next 24 hours.   Take frequent rest periods for the next 24 hours.   Walking will help expel (get rid of) the air and reduce the bloated feeling in your abdomen.   No driving for 24 hours (because of the anesthesia (medicine) used during the test).   You may shower.   Do not sign any important legal documents or operate any machinery for 24 hours (because of the anesthesia used during the test).  NUTRITION  Drink plenty of fluids.   You may resume your normal diet.   Begin with a light meal and progress to your normal diet.   Avoid alcoholic beverages for 24 hours or as instructed by your caregiver.  MEDICATIONS  You may resume your normal medications unless your caregiver tells you otherwise.  WHAT YOU CAN EXPECT TODAY  You may experience abdominal discomfort such as a feeling of fullness or "gas" pains.  FOLLOW-UP  Your doctor will discuss the results of your test with you.  SEEK IMMEDIATE MEDICAL ATTENTION IF ANY OF THE FOLLOWING OCCUR:  Excessive nausea (feeling sick to your stomach) and/or vomiting.   Severe abdominal pain and distention (swelling).   Trouble swallowing.   Temperature over 101 F (37.8 C).   Rectal bleeding or vomiting of blood.   You did not have any evidence of esophageal varices on your endoscopy.  We will plan on repeating this in 2 years or sooner if anything  changes.  Follow-up with me next week as previously scheduled.   I hope you have a great rest of your week!  Hennie Duos. Marletta Lor, D.O. Gastroenterology and Hepatology [facility]

## 2020-01-10 NOTE — Addendum Note (Signed)
Addendum  created 01/10/20 1332 by Moshe Salisbury, CRNA   Charge Capture section accepted

## 2020-01-10 NOTE — Op Note (Signed)
[facility] Patient Name: Marcus Pham Procedure Date: 01/10/2020 12:59 PM MRN: [id number] Date of Birth: 1950-06-28 Attending MD: Elon Alas. Abbey Chatters DO CSN: [id number] Age: 69 Admit Type: Outpatient Procedure:                Upper GI endoscopy Indications:              Cirrhosis rule out esophageal varices Providers:                Elon Alas. Abbey Chatters, DO, Lambert Mody, Dereck Leep, Technician, Wynonia Musty Tech,                            Merchant navy officer Referring MD:              Medicines:                See the Anesthesia note for documentation of the                            administered medications Complications:            No immediate complications. Estimated Blood Loss:     Estimated blood loss: none. Procedure:                Pre-Anesthesia Assessment:                           - The anesthesia plan was to use monitored                            anesthesia care (MAC).                           After obtaining informed consent, the endoscope was                            passed under direct vision. Throughout the                            procedure, the patient's blood pressure, pulse, and                            oxygen saturations were monitored continuously. The                            Endoscope was introduced through the mouth, and                            advanced to the second part of duodenum. The upper                            GI endoscopy was accomplished without difficulty.                            The patient tolerated the procedure well.  Scope In: 1:19:28 PM Scope Out: 1:21:38 PM Total Procedure Duration: 0 hours 2 minutes 10 seconds  Findings:      There is no endoscopic evidence of Barrett's esophagus, bleeding,       esophagitis, ulcerations or varices in the entire esophagus.      A small hiatal hernia was present.      Moderate portal hypertensive gastropathy was found in the entire        examined stomach.      The duodenal bulb, first portion of the duodenum and second portion of       the duodenum were normal. Impression:               - Small hiatal hernia.                           - Portal hypertensive gastropathy.                           - Normal duodenal bulb, first portion of the                            duodenum and second portion of the duodenum.                           - No specimens collected. Moderate Sedation:      Per Anesthesia Care Recommendation:           - Patient has a contact number available for                            emergencies. The signs and symptoms of potential                            delayed complications were discussed with the                            patient. Return to normal activities tomorrow.                            Written discharge instructions were provided to the                            patient.                           - Resume previous diet.                           - Continue present medications.                           - Repeat upper endoscopy in 2 years for screening                            purposes.                           - Return to GI clinic  as previously scheduled. Procedure Code(s):        --- Professional ---                           [phone], Esophagogastroduodenoscopy, flexible,                            transoral; diagnostic, including collection of                            specimen(s) by brushing or washing, when performed                            (separate procedure) Diagnosis Code(s):        --- Professional ---                           K44.9, Diaphragmatic hernia without obstruction or                            gangrene                           K76.6, Portal hypertension                           K31.89, Other diseases of stomach and duodenum                           K74.60, Unspecified cirrhosis of liver CPT copyright 2019 American Medical Association. All rights  reserved. The codes documented in this report are preliminary and upon coder review may  be revised to meet current compliance requirements. Elon Alas. Abbey Chatters, DO [place] Abbey Chatters, DO 01/10/2020 1:32:39 PM This report has been signed electronically. Number of Addenda: 0

## 2020-01-16 IMAGING — US ULTRASOUND ABDOMEN LIMITED
1 series · 14 of 25 positions shown · non-contrast
Comparison: Ultrasound 05/21/2017.

CLINICAL DATA: Abnormal liver enzymes. ETOH abuse. History of
cholelithiasis.

EXAM:
ULTRASOUND ABDOMEN LIMITED RIGHT UPPER QUADRANT

[Series 1: ultrasound abdomen limited · 14 of 66 slices shown]
[im 1/66]
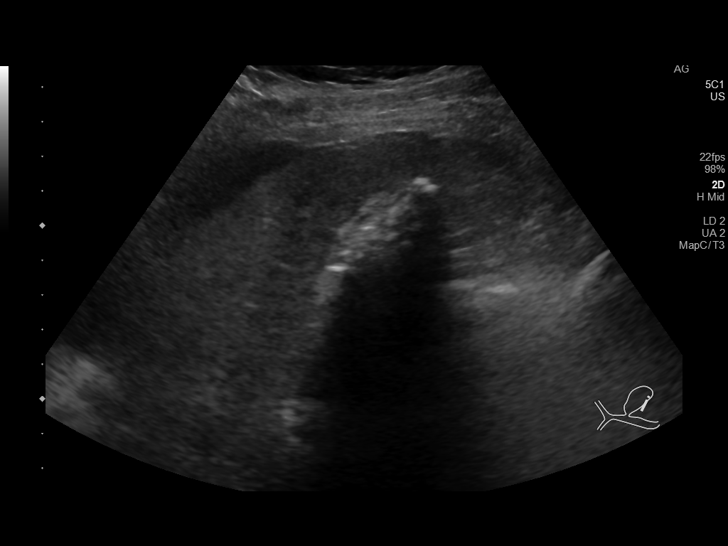
[im 6/66]
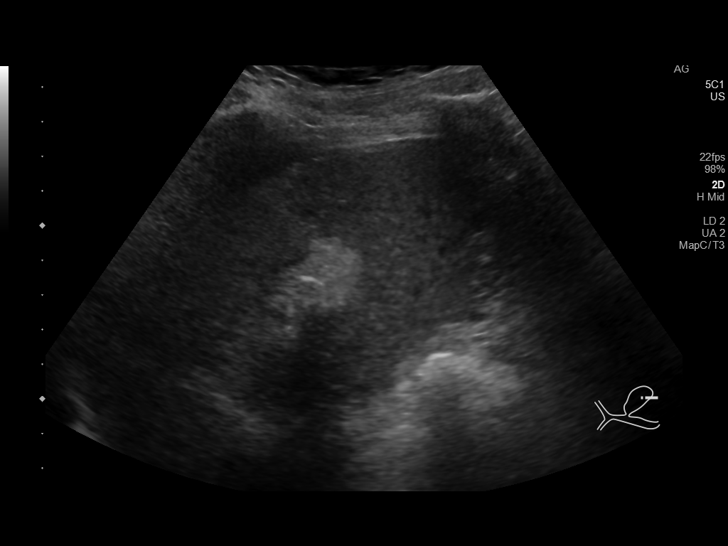
[im 11/66]
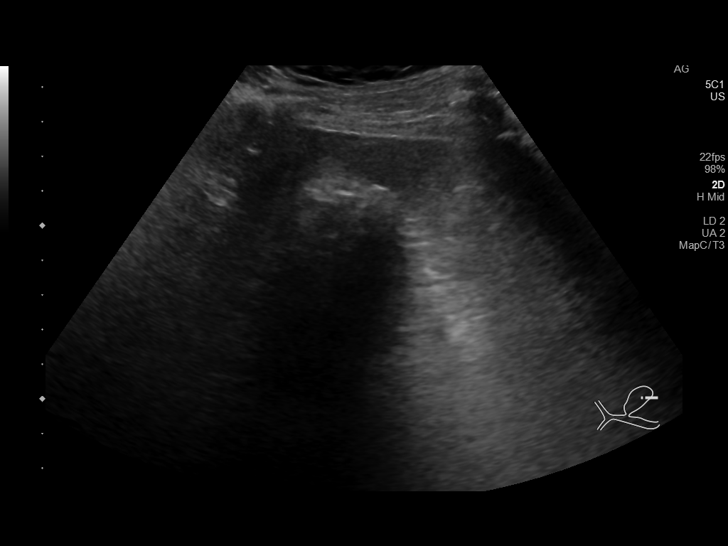
[im 17/66]
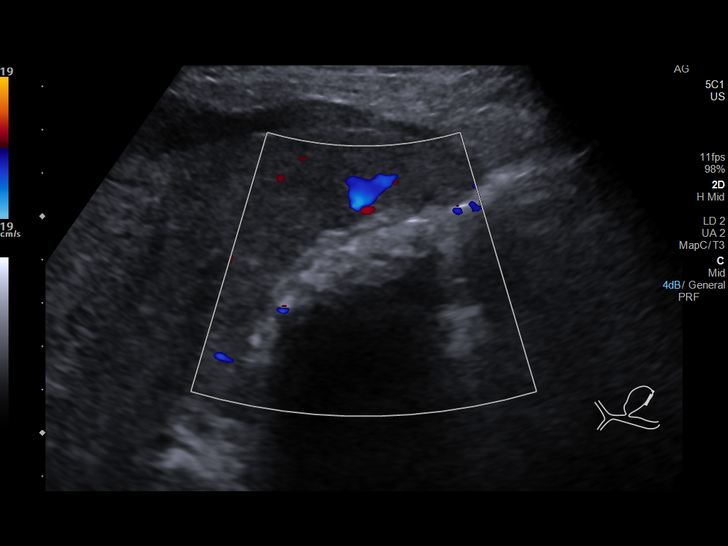
[im 22/66]
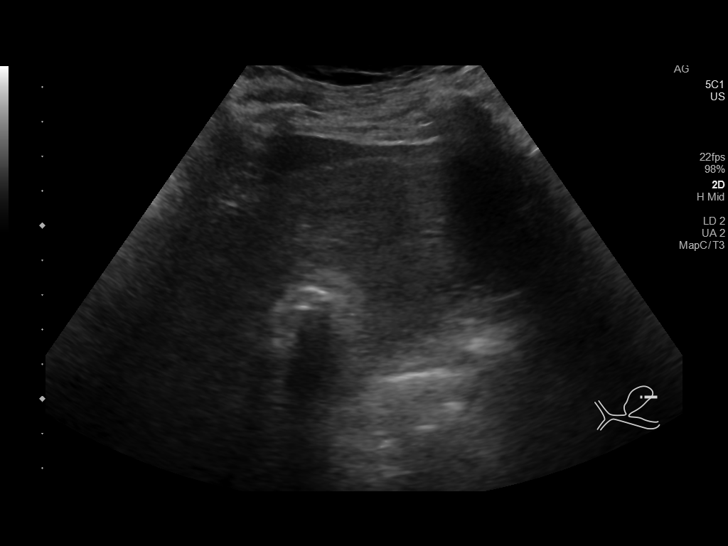
[im 25/66]
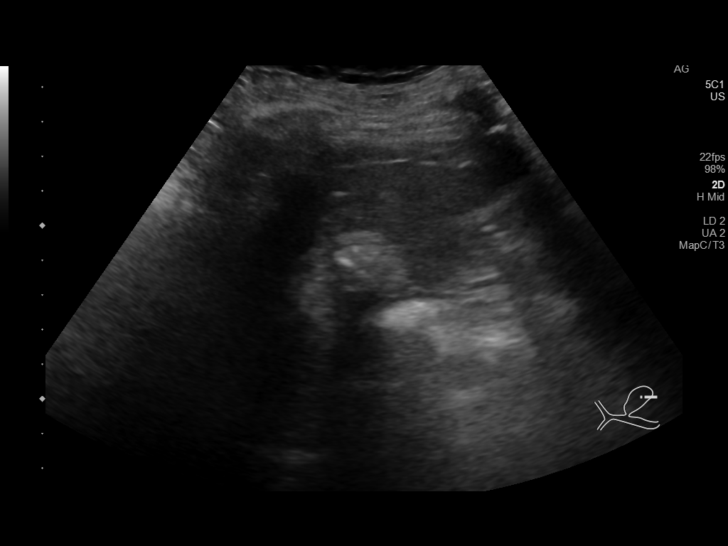
[im 30/66]
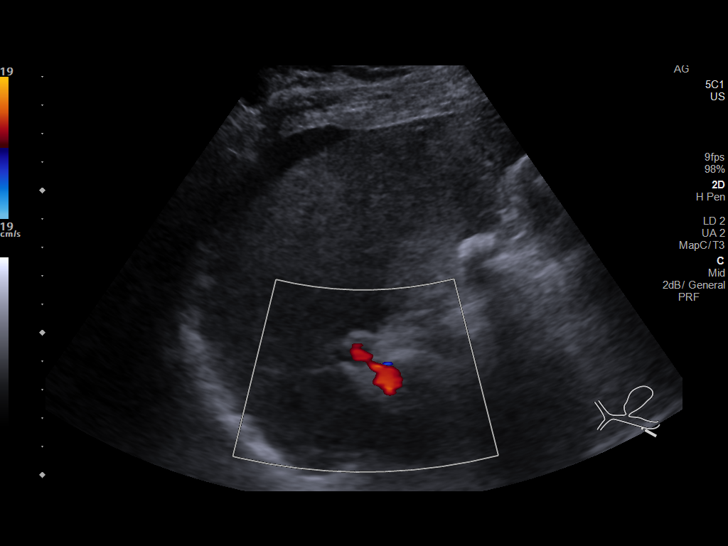
[im 36/66]
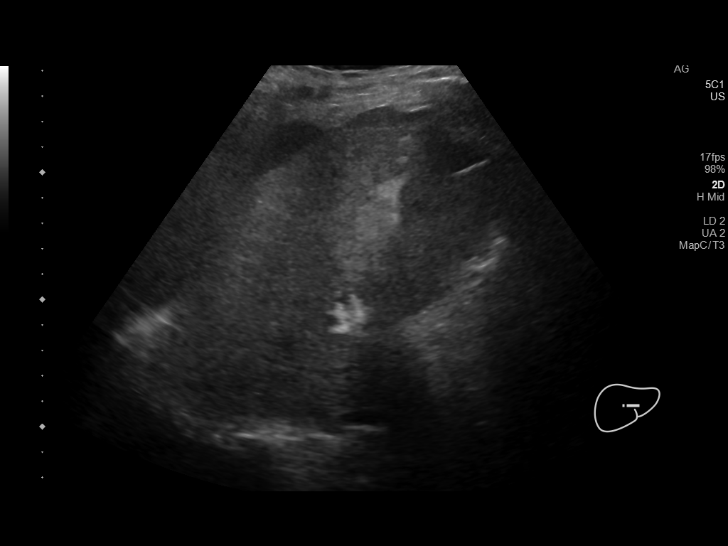
[im 41/66]
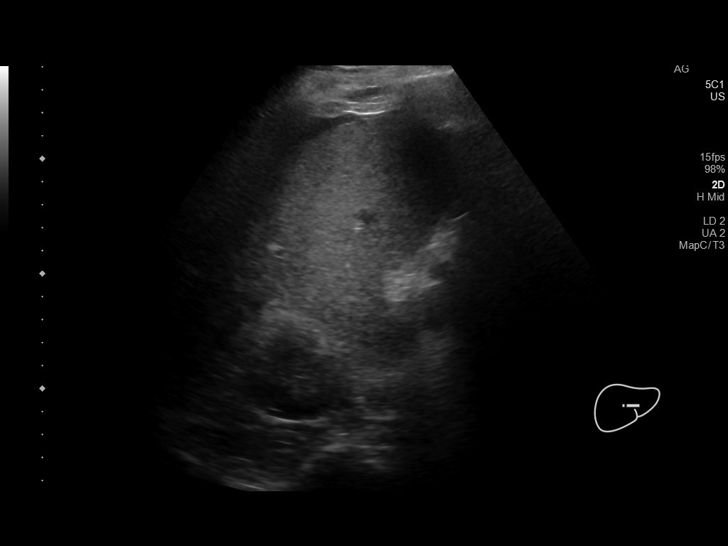
[im 44/66]
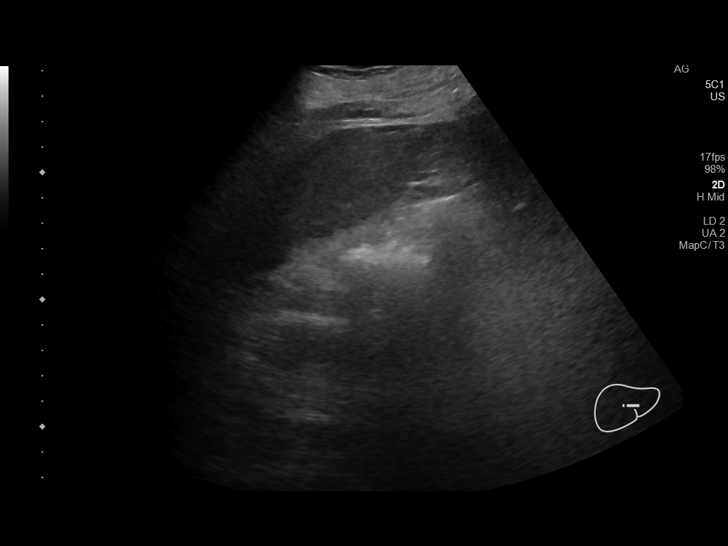
[im 49/66]
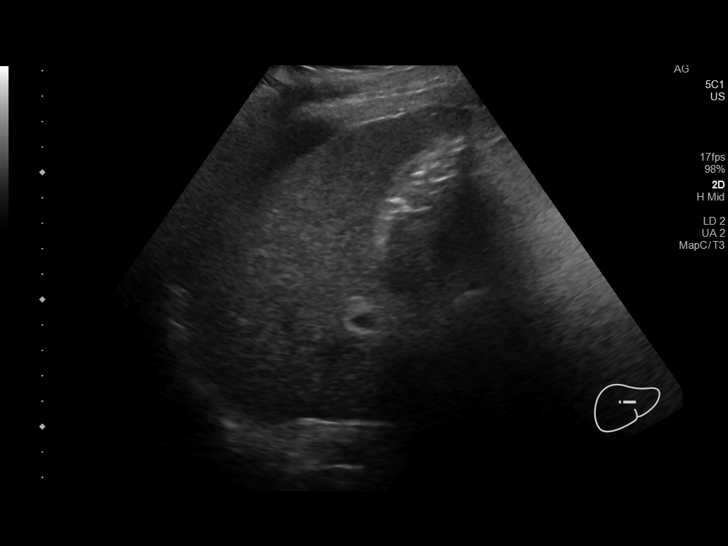
[im 55/66]
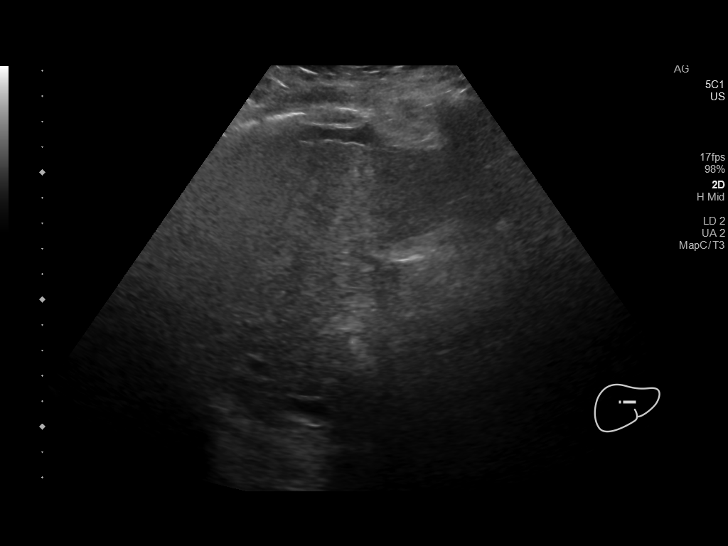
[im 60/66]
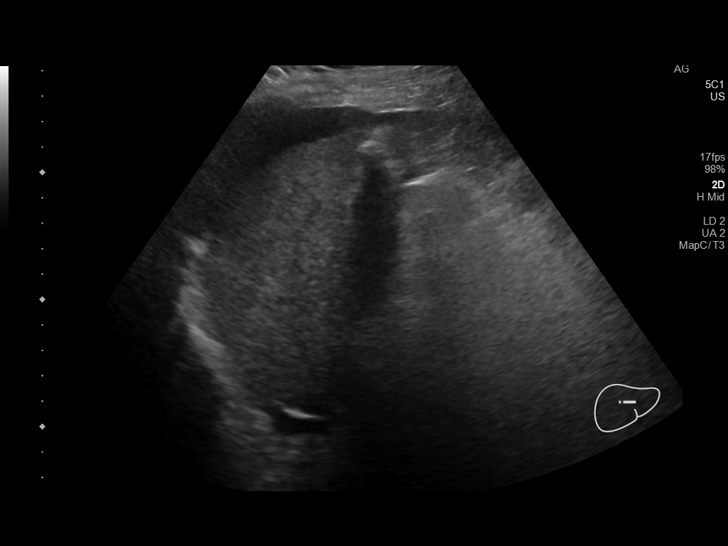
[im 66/66]
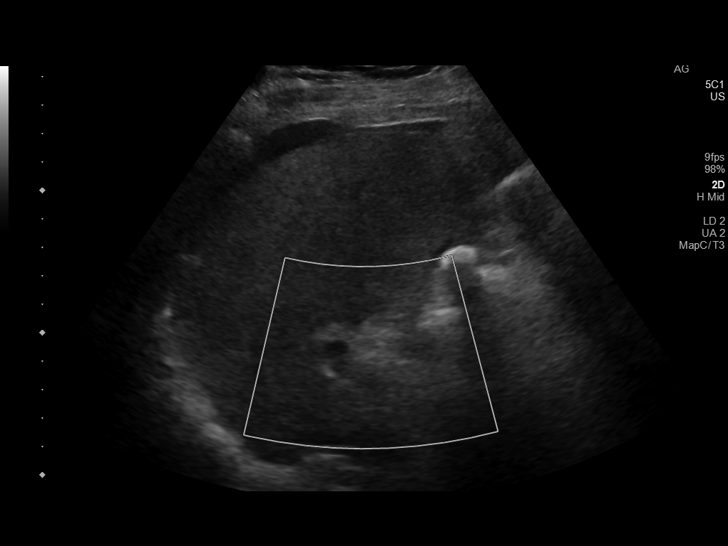

[14 of 25 positions shown; findings below may reference images not displayed]

FINDINGS: Gallbladder:

Prominent shadowing echoes noted the gallbladder fossa consistent
with gallstones. Gallbladder wall thickness 4 mm. Cholecystitis
cannot be excluded. Negative Murphy sign.

Common bile duct:

Diameter: 2.6 mm

Liver:

Increased hepatic echogenicity consistent fatty infiltration or
hepatocellular disease. No focal hepatic abnormality identified.

Other findings: Mild ascites.
IMPRESSION: 1. Gallstones. Thickening of the gallbladder wall at 4 mm.
Cholecystitis cannot be excluded. No biliary distention.

2. Increased hepatic echogenicity consistent fatty infiltration or
hepatocellular disease. Mild ascites.

## 2020-01-17 ENCOUNTER — Inpatient Hospital Stay: Payer: Non-veteran care | Admitting: Hematology

## 2020-01-17 ENCOUNTER — Encounter (HOSPITAL_COMMUNITY): Payer: Self-pay | Admitting: Internal Medicine

## 2020-01-18 ENCOUNTER — Ambulatory Visit (INDEPENDENT_AMBULATORY_CARE_PROVIDER_SITE_OTHER): Payer: No Typology Code available for payment source | Admitting: Internal Medicine

## 2020-01-18 ENCOUNTER — Other Ambulatory Visit: Payer: Self-pay

## 2020-01-18 ENCOUNTER — Encounter: Payer: Self-pay | Admitting: Internal Medicine

## 2020-01-18 VITALS — BP 152/85 | HR 71 | Temp 96.2°F | Ht 73.0 in | Wt 203.0 lb

## 2020-01-18 DIAGNOSIS — K7290 Hepatic failure, unspecified without coma: Secondary | ICD-10-CM | POA: Diagnosis not present

## 2020-01-18 DIAGNOSIS — K7682 Hepatic encephalopathy: Secondary | ICD-10-CM

## 2020-01-18 DIAGNOSIS — R188 Other ascites: Secondary | ICD-10-CM | POA: Diagnosis not present

## 2020-01-18 DIAGNOSIS — K7460 Unspecified cirrhosis of liver: Secondary | ICD-10-CM

## 2020-01-18 NOTE — Patient Instructions (Signed)
We will check blood work today including liver labs as well as kidney function and electrolytes.  I will also check your viral hepatitis a vaccination status.  Continue on Lasix and Aldactone for fluid overload.  Continue on low-sodium and high-protein diet.  Continue Xifaxan for confusion and fatigue.  I would recommend taking lactulose on days that you feel more fatigued than usual.  We will plan on ultrasound in 6 months for liver cancer screening.  We will plan on repeat EGD in 2 years.  Follow-up in 3 months or sooner if needed.  At [facility] we value your feedback. You may receive a survey about your visit today. Please share your experience as we strive to create trusting relationships with our patients to provide genuine, compassionate, quality care.  We appreciate your understanding and patience as we review any laboratory studies, imaging, and other diagnostic tests that are ordered as we care for you. Our office policy is 5 business days for review of these results, and any emergent or urgent results are addressed in a timely manner for your best interest. If you do not hear from our office in 1 week, please contact us.   We also encourage the use of MyChart, which contains your medical information for your review as well. If you are not enrolled in this feature, an access code is on this after visit summary for your convenience. Thank you for allowing [place] to be involved in your care.  It was great to see you today!  I hope you have a great rest of your winter!!    Hennie Duos. Marletta Lor, D.O. Gastroenterology and Hepatology [facility]

## 2020-01-19 LAB — COMPLETE METABOLIC PANEL WITH GFR
AG Ratio: 0.7 (calc) — ABNORMAL LOW (ref 1.0–2.5)
ALT: 23 U/L (ref 9–46)
AST: 36 U/L — ABNORMAL HIGH (ref 10–35)
Albumin: 2.6 g/dL — ABNORMAL LOW (ref 3.6–5.1)
Alkaline phosphatase (APISO): 163 U/L — ABNORMAL HIGH (ref 35–144)
BUN: 15 mg/dL (ref 7–25)
CO2: 29 mmol/L (ref 20–32)
Calcium: 8.4 mg/dL — ABNORMAL LOW (ref 8.6–10.3)
Chloride: 99 mmol/L (ref 98–110)
Creat: 1.08 mg/dL (ref 0.70–1.18)
GFR, Est African American: 80 mL/min/{1.73_m2} (ref >=60)
GFR, Est Non African American: 69 mL/min/{1.73_m2} (ref >=60)
Globulin: 3.5 g/dL (calc) (ref 1.9–3.7)
Glucose, Bld: 163 mg/dL — ABNORMAL HIGH (ref 65–139)
Potassium: 3.8 mmol/L (ref 3.5–5.3)
Sodium: 137 mmol/L (ref 135–146)
Total Bilirubin: 4 mg/dL — ABNORMAL HIGH (ref 0.2–1.2)
Total Protein: 6.1 g/dL (ref 6.1–8.1)

## 2020-01-19 LAB — HEPATITIS A ANTIBODY, TOTAL: Hepatitis A AB,Total: NONREACTIVE

## 2020-01-19 LAB — PROTIME-INR
INR: 1.3 — ABNORMAL HIGH
Prothrombin Time: 14 s — ABNORMAL HIGH (ref 9.0–11.5)

## 2020-01-19 NOTE — Progress Notes (Signed)
Referring Provider: Thornton Papas, Mamie Nick* Primary Care Physician:  Thornton Papas, PA-C Primary GI:  Dr. Abbey Chatters  Chief Complaint  Patient presents with  . Follow-up    Fu from EGD    HPI:   Marcus Pham is a 70 y.o. male who presents for follow-up visit.  Patient previously seen due to abnormal LFTs, found to have a T bili of 3.3, alk phos 212, ALT 29, AST 46 last ultrasound 08/03/2018 which showed fatty liver and mild ascites.    Upon initial consultation I ordered blood work to be performed including full serological work-up but patient was confused and did not have this done.  I ordered an ultrasound with elastography and patient did show up for this.  I received a call from radiology stating that patient had large amount of ascites and test could not be performed.  He subsequently underwent paracentesis with 4 L of ascitic fluid removed.  Fluid albumin was reported as less than 1, serum albumin 2.8 indicating a SAAG of greater than 1.1, fluid protein was also low indicating that patient's ascites is likely from portal hypertension.  Subsequent ultrasound showed cirrhotic appearing liver, elastography with significantly elevated median kPa of 37.8.  Patient notes previous alcohol abuse drinking 6-8 beers at least daily for many years. He stopped drinking early November 2021. Denies any abdominal pain. No weight loss. No GI bleeding or swelling. Denies any family history of liver disease. No risk factors for viral hepatitis. Denies drug use. No new medications. No herbal supplements. I have blood work from 2017 that also showed elevated LFTs, though his bili was normal at that time.  His daughter [name] and wife [name] accompany him today.    Patient has history of hepatic encephalopathy which is maintained on Xifaxan as well as lactulose.  Patient endorses not taking the lactulose every day.  Patient's daughter does note that since being on the Xifaxan he does  seem to have more good days than bad days.  Appears more lucid.  For hypervolemia he is maintained on Lasix 40 mg and spironolactone 100 mg daily.  Denies any abdominal distention for me today.  States he is avoiding salt best he can.  Patient underwent EGD 01/10/2020 for variceal screening without evidence of esophageal varices.  Ultrasound 12/06/2019 without evidence of HCC.  Past Medical History:  Diagnosis Date  . Anxiety   . Arthritis    back and hands  . Depression    PTSD  . GERD (gastroesophageal reflux disease)   . Hyperlipidemia   . Hypertension   . Substance abuse ([place])    patient denies but says he drinks up to 12 beers a day  . Thyroid disease     Past Surgical History:  Procedure Laterality Date  . 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 ESOPHAGOGASTRODUODENOSCOPY (EGD) WITH PROPOFOL N/A 01/10/2020   Procedure: ESOPHAGOGASTRODUODENOSCOPY (EGD) WITH PROPOFOL;  Surgeon: Eloise Harman, DO;  Location: [facility];  Service: Endoscopy;  Laterality: N/A;  2:15pm  . ROTATOR CUFF REPAIR Left    2002-2003?  . TONSILLECTOMY     as a child    Current Outpatient Medications  Medication Sig Dispense Refill  . furosemide (LASIX) 40 MG tablet Take 1 tablet (40 mg total) by mouth daily. 30 tablet 5  . ibuprofen (ADVIL,MOTRIN) 800 MG tablet Take 800 mg by mouth as needed for moderate pain.    Marland Kitchen lactulose (CHRONULAC) 10 GM/15ML solution Take 30 mLs (  20 g total) by mouth 3 (three) times daily. Titrate up to 2-3 loose BMs daily (Patient taking differently: Take 20 g by mouth as needed for moderate constipation. Titrate up to 2-3 loose BMs daily) 2700 mL 5  . methocarbamol (ROBAXIN) 750 MG tablet Take 750 mg by mouth as needed for muscle spasms.    Marland Kitchen omeprazole (PRILOSEC) 20 MG capsule Take 20 mg by mouth daily as needed (acid reflux).    . rifaximin (XIFAXAN) 550 MG TABS tablet Take 1 tablet (550 mg total) by mouth 2 (two) times daily. 60 tablet 5  . spironolactone (ALDACTONE)  100 MG tablet Take 1 tablet (100 mg total) by mouth daily. 30 tablet 5   No current facility-administered medications for this visit.    Allergies as of 01/18/2020  . (No Known Allergies)    Family History  Problem Relation Age of Onset  . Hypertension Mother   . Arthritis Mother   . Heart disease Father 80       CHF  . Alcohol abuse Brother        substance abuse, died of trauma  . Heart disease Paternal Grandfather 43       MI    Social History   Socioeconomic History  . Marital status: Married    Spouse name: Not on file  . Number of children: Not on file  . Years of education: Not on file  . Highest education level: Not on file  Occupational History  . Not on file  Tobacco Use  . Smoking status: Former Research scientist (life sciences)  . Smokeless tobacco: Never Used  . Tobacco comment: quit 40 years ago   Substance and Sexual Activity  . Alcohol use: Not Currently    Alcohol/week: 4.0 - 5.0 standard drinks    Types: 4 - 5 Cans of beer per week  . Drug use: Not Currently    Types: Marijuana    Comment: occasional marijuana use  . Sexual activity: Yes  Other Topics Concern  . Not on file  Social History Narrative  . Not on file   Social Determinants of Health   Financial Resource Strain: Not on file  Food Insecurity: Not on file  Transportation Needs: Not on file  Physical Activity: Not on file  Stress: Not on file  Social Connections: Not on file    Subjective: Review of Systems  Constitutional: Negative for chills and fever.  HENT: Negative for congestion and hearing loss.   Eyes: Negative for blurred vision and double vision.  Respiratory: Negative for cough and shortness of breath.   Cardiovascular: Negative for chest pain and palpitations.  Gastrointestinal: Negative for abdominal pain, blood in stool, constipation, diarrhea, heartburn, melena and vomiting.  Genitourinary: Negative for dysuria and urgency.  Musculoskeletal: Negative for joint pain and myalgias.   Skin: Negative for itching and rash.  Neurological: Negative for dizziness and headaches.  Psychiatric/Behavioral: Negative for depression. The patient is not nervous/anxious.      Objective: BP (!) 152/85   Pulse 71   Temp (!) 96.2 F (35.7 C) (Temporal)   Ht 6' 1"  (1.854 m)   Wt 203 lb (92.1 kg)   BMI 26.78 kg/m  Physical Exam Constitutional:      Appearance: Normal appearance.  HENT:     Head: Normocephalic and atraumatic.  Eyes:     General: Scleral icterus present.     Extraocular Movements: Extraocular movements intact.  Cardiovascular:     Rate and Rhythm: Normal rate and regular  rhythm.  Pulmonary:     Effort: Pulmonary effort is normal.     Breath sounds: Normal breath sounds.  Abdominal:     General: Bowel sounds are normal.     Palpations: Abdomen is soft.  Musculoskeletal:        General: Normal range of motion.     Cervical back: Normal range of motion and neck supple.  Skin:    General: Skin is warm.     Coloration: Skin is jaundiced.  Neurological:     General: No focal deficit present.     Mental Status: He is alert and oriented to person, place, and time.  Psychiatric:        Mood and Affect: Mood normal.        Behavior: Behavior normal.      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  I also discussed complications associated and treatment strategies as well.  -Etiology of patient's decompensated cirrhosis likely due to alcohol.  Serological work-up negative for other causes.  -Update meld labs today.    -Patient appears euvolemic in office today.  Continue on Lasix 40 mg daily and Aldactone 100 mg daily.  We will check renal function electrolytes today.  -I counseled patient and daughter on the vast importance of a  low-sodium diet, less than 2000 mg daily.  Also counseled on ensuring that patient gets enough protein in his diet to reduce risk of muscle wasting.  -Hepatic encephalopathy appears to be improved on Xifaxan.  We will continue.  Patient counseled to take lactulose as well especially if he is feeling more fatigued. Counseled patient on titrating up his lactulose to ensure at least 2-3 loose bowel movements daily.  -EGD 01/10/2020 for variceal screening without evidence of esophageal varices.  Repeat 12/2021 or sooner if further decompensation.  -Ultrasound 12/06/2019 without evidence of HCC.  Repeat 06/01/2020.  -Check hepatitis a and B vaccination status  Discussed long-term prognosis of decompensated cirrhosis with patient and daughter today.  He has elevated risk of mortality given decompensated nature of his liver disease.  I did counsel on the vast importance of alcohol cessation as we do see some regression in alcoholic induced cirrhosis with sobriety.  Discussed liver transplant with patient and daughter today.  I did note the patient has multiple obstacles to this including age, recent alcohol use, and comorbidities.  We will work to get complications from cirrhosis under control.  Likely refer to hepatology in the future.   Patient to follow-up in 3 months or sooner if needed.    01/19/2020 2:00 PM   Disclaimer: This note was dictated with voice recognition software. Similar sounding words can inadvertently be transcribed and may not be corrected upon review.

## 2020-02-09 ENCOUNTER — Ambulatory Visit: Payer: Non-veteran care | Admitting: Internal Medicine

## 2020-03-13 ENCOUNTER — Other Ambulatory Visit: Payer: Self-pay

## 2020-03-13 ENCOUNTER — Telehealth: Payer: Self-pay

## 2020-03-13 ENCOUNTER — Telehealth: Payer: Self-pay | Admitting: Internal Medicine

## 2020-03-13 DIAGNOSIS — K7682 Hepatic encephalopathy: Secondary | ICD-10-CM

## 2020-03-13 DIAGNOSIS — K7290 Hepatic failure, unspecified without coma: Secondary | ICD-10-CM

## 2020-03-13 DIAGNOSIS — K7031 Alcoholic cirrhosis of liver with ascites: Secondary | ICD-10-CM

## 2020-03-13 NOTE — Telephone Encounter (Signed)
We can certainly check blood work in anticipation of his visit.  Please order CMP, PT/INR, and ammonia level to whatever lab they use. I will call and discuss these results with her when they result. Thanks

## 2020-03-13 NOTE — Telephone Encounter (Signed)
Please see other note in chart, thank you

## 2020-03-13 NOTE — Telephone Encounter (Signed)
noted 

## 2020-03-13 NOTE — Telephone Encounter (Signed)
Lab forms done (CMP, PT/INR and ammonia). Put in the mail to the pt and his daughter.   2. Phoned and advised the pt's daughter of the lab forms being put in the mail to them and to have it done a week before pt's OV. She agreed

## 2020-03-13 NOTE — Telephone Encounter (Signed)
Good Morning Dr. Abbey Pham  this pt's daughter Marcus Pham) phoned to advise [place] that the pt has starting having more bad days than good days. She has noticed that his hands are shaking more than normal and she spoke with him today and he advised her that yesterday he really had a rough day. His hands seemed to be uncontrollable. He's still taking his medications as advised. They have a up coming appt with you on March 31st and Marcus Pham (pt's daughter) wants to know will there be labs that you may want to have done that way they can be discussed @ the visit. The pt's daughter states it up to you but she would like to be able to sit and talk with you regarding her fathers blood work. She understands that this is part of her fathers progressing disease but thought you needed to be brought up to speed about what is taking place with him. Please advise.

## 2020-03-19 ENCOUNTER — Telehealth: Payer: Self-pay | Admitting: Internal Medicine

## 2020-03-19 NOTE — Telephone Encounter (Signed)
Will await para order from Dr. Abbey Chatters. Pt doesn't have standing order.

## 2020-03-19 NOTE — Telephone Encounter (Signed)
Spoke with pts daughter and she feels her father needs a para. Pt's abdomen is distended some since his last appointment, pt has woken up out of his sleep twice last week with severe abdominal pain, pt has some swelling in his ankles, some sob and coughing. Pt is taking Laculose, Spironlactone and Lasix. Pt isn't have 2-3 bowel movements a day. Pts daughter is going to her fathers house soon to see if he is taking his medication correctly. Pt may be having 1 large BM qd- qod.

## 2020-03-19 NOTE — Telephone Encounter (Signed)
Pt's daughter, [name], called to say that patient is needing a para. She thinks Dr Abbey Chatters was going to have standing orders but wasn't sure. Please advise. [phone]

## 2020-03-19 NOTE — Telephone Encounter (Signed)
Do you want him to have any labs and albumin per protocol?

## 2020-03-19 NOTE — Telephone Encounter (Signed)
Okay to order para, limit to 5L removed thank you

## 2020-03-20 ENCOUNTER — Other Ambulatory Visit: Payer: Self-pay

## 2020-03-20 DIAGNOSIS — K7031 Alcoholic cirrhosis of liver with ascites: Secondary | ICD-10-CM

## 2020-03-20 NOTE — Telephone Encounter (Signed)
Albumin per protocol yes, CBC CMP and INR should already be ordered

## 2020-03-21 NOTE — Telephone Encounter (Signed)
Called pt's daughter, [name], informed her of para appt tomorrow. She said pt thinks it may not be enough fluid to draw off and he may not want to go to appt. Gave her number for central scheduling if he decides to cancel appt.

## 2020-03-21 NOTE — Telephone Encounter (Signed)
Para scheduled for tomorrow at 10:00am, arrive at 9:45am.  Tried to call daughter, no answer, LMOVM for return call.

## 2020-03-22 ENCOUNTER — Ambulatory Visit (HOSPITAL_COMMUNITY)
Admission: RE | Admit: 2020-03-22 | Discharge: 2020-03-22 | Disposition: A | Discharge status: Home / Self Care | Payer: No Typology Code available for payment source | Source: Ambulatory Visit | Attending: Internal Medicine | Admitting: Internal Medicine

## 2020-03-22 ENCOUNTER — Encounter (HOSPITAL_COMMUNITY): Payer: Self-pay

## 2020-03-22 DIAGNOSIS — K7031 Alcoholic cirrhosis of liver with ascites: Secondary | ICD-10-CM | POA: Insufficient documentation

## 2020-03-22 LAB — BODY FLUID CELL COUNT WITH DIFFERENTIAL
Eos, Fluid: 0 %
Lymphs, Fluid: 41 %
Monocyte-Macrophage-Serous Fluid: 30 % — ABNORMAL LOW (ref 50–90)
Neutrophil Count, Fluid: 29 % — ABNORMAL HIGH (ref 0–25)
Total Nucleated Cell Count, Fluid: 249 cu mm (ref 0–1000)

## 2020-03-22 LAB — GRAM STAIN

## 2020-03-22 MED ORDER — ALBUMIN HUMAN 25 % IV SOLN
INTRAVENOUS | Status: AC
  Filled 2020-03-22: qty 200

## 2020-03-22 MED ORDER — ALBUMIN HUMAN 25 % IV SOLN: 0.0000 g | Freq: Once | INTRAVENOUS | Status: DC

## 2020-03-22 NOTE — Discharge Instructions (Signed)
Paracentesis, Care After This sheet gives you information about how to care for yourself after your procedure. Your health care provider may also give you more specific instructions. If you have problems or questions, contact your health care provider. What can I expect after the procedure? After the procedure, it is common to have a small amount of clear fluid coming from the puncture site. Follow these instructions at home: Puncture site care  Follow instructions from your health care provider about how to take care of your puncture site. Make sure you: ? Wash your hands with soap and water before and after you change your bandage (dressing). If soap and water are not available, use hand sanitizer. ? Change your dressing as told by your health care provider.  Check your puncture area every day for signs of infection. Check for: ? Redness, swelling, or pain. ? More fluid or blood. ? Warmth. ? Pus or a bad smell.   General instructions  Return to your normal activities as told by your health care provider. Ask your health care provider what activities are safe for you.  Take over-the-counter and prescription medicines only as told by your health care provider.  Do not take baths, swim, or use a hot tub until your health care provider approves. Ask your health care provider if you may take showers. You may only be allowed to take sponge baths.  Keep all follow-up visits as told by your health care provider. This is important. Contact a health care provider if:  You have redness, swelling, or pain at your puncture site.  You have more fluid or blood coming from your puncture site.  Your puncture site feels warm to the touch.  You have pus or a bad smell coming from your puncture site.  You have a fever. Get help right away if:  You have chest pain or shortness of breath.  You develop increasing pain, discomfort, or swelling in your abdomen.  You feel dizzy or light-headed or  you faint. Summary  After the procedure, it is common to have a small amount of clear fluid coming from the puncture site.  Follow instructions from your health care provider about how to take care of your puncture site.  Check your puncture area every day signs of infection.  Keep all follow-up visits as told by your health care provider. This information is not intended to replace advice given to you by your health care provider. Make sure you discuss any questions you have with your health care provider. Document Revised: 07/13/2018 Document Reviewed: 10/20/2017 Elsevier Patient Education  2021 Elsevier Inc.  

## 2020-03-22 NOTE — Sedation Documentation (Signed)
Pt tolerated right sided paracentesis procedure well today and 2.2 Liters of clear yellow fluid removed with labs collected and sent for processing. PT ambulatory at discharge with no acute distress noted and verbalized understanding of discharge instructions.

## 2020-03-23 LAB — PATHOLOGIST SMEAR REVIEW

## 2020-03-27 LAB — CULTURE, BODY FLUID W GRAM STAIN -BOTTLE: Culture: NO GROWTH

## 2020-04-02 ENCOUNTER — Other Ambulatory Visit: Payer: Self-pay

## 2020-04-02 ENCOUNTER — Ambulatory Visit (INDEPENDENT_AMBULATORY_CARE_PROVIDER_SITE_OTHER): Payer: No Typology Code available for payment source | Admitting: Internal Medicine

## 2020-04-02 ENCOUNTER — Encounter: Payer: Self-pay | Admitting: Internal Medicine

## 2020-04-02 VITALS — BP 128/73 | HR 77 | Resp 18 | Ht 73.0 in | Wt 212.1 lb

## 2020-04-02 DIAGNOSIS — F331 Major depressive disorder, recurrent, moderate: Secondary | ICD-10-CM | POA: Insufficient documentation

## 2020-04-02 DIAGNOSIS — K7460 Unspecified cirrhosis of liver: Secondary | ICD-10-CM

## 2020-04-02 DIAGNOSIS — K7290 Hepatic failure, unspecified without coma: Secondary | ICD-10-CM | POA: Diagnosis not present

## 2020-04-02 DIAGNOSIS — J40 Bronchitis, not specified as acute or chronic: Secondary | ICD-10-CM | POA: Diagnosis not present

## 2020-04-02 DIAGNOSIS — F1010 Alcohol abuse, uncomplicated: Secondary | ICD-10-CM | POA: Insufficient documentation

## 2020-04-02 DIAGNOSIS — M545 Low back pain, unspecified: Secondary | ICD-10-CM

## 2020-04-02 DIAGNOSIS — K7031 Alcoholic cirrhosis of liver with ascites: Secondary | ICD-10-CM | POA: Diagnosis not present

## 2020-04-02 DIAGNOSIS — I10 Essential (primary) hypertension: Secondary | ICD-10-CM | POA: Diagnosis not present

## 2020-04-02 DIAGNOSIS — Z7689 Persons encountering health services in other specified circumstances: Secondary | ICD-10-CM

## 2020-04-02 DIAGNOSIS — K219 Gastro-esophageal reflux disease without esophagitis: Secondary | ICD-10-CM

## 2020-04-02 DIAGNOSIS — F4310 Post-traumatic stress disorder, unspecified: Secondary | ICD-10-CM | POA: Insufficient documentation

## 2020-04-02 DIAGNOSIS — G8929 Other chronic pain: Secondary | ICD-10-CM

## 2020-04-02 DIAGNOSIS — E669 Obesity, unspecified: Secondary | ICD-10-CM | POA: Insufficient documentation

## 2020-04-02 MED ORDER — AZITHROMYCIN 250 MG PO TABS: ORAL_TABLET | ORAL | 0 refills | Status: DC

## 2020-04-02 MED ORDER — BENZONATATE 100 MG PO CAPS: 100.0000 mg | ORAL_CAPSULE | Freq: Two times a day (BID) | ORAL | 0 refills | Status: DC | PRN

## 2020-04-02 NOTE — Patient Instructions (Addendum)
Please continue to take medications as prescribed.  Please follow up with your GI specialist as scheduled.  Please take Azithromycin as prescribed and Benzonatate as needed for cough. Please contact us if you have any new symptoms like fever, chills, blood in sputum, or shortness of breath.  Thank you for choosing [facility]! We consider it our privilege to take care of you!

## 2020-04-03 LAB — COMPREHENSIVE METABOLIC PANEL
AG Ratio: 0.8 (calc) — ABNORMAL LOW (ref 1.0–2.5)
ALT: 27 U/L (ref 9–46)
AST: 50 U/L — ABNORMAL HIGH (ref 10–35)
Albumin: 2.7 g/dL — ABNORMAL LOW (ref 3.6–5.1)
Alkaline phosphatase (APISO): 231 U/L — ABNORMAL HIGH (ref 35–144)
BUN: 13 mg/dL (ref 7–25)
CO2: 25 mmol/L (ref 20–32)
Calcium: 8.4 mg/dL — ABNORMAL LOW (ref 8.6–10.3)
Chloride: 99 mmol/L (ref 98–110)
Creat: 1.03 mg/dL (ref 0.70–1.18)
Globulin: 3.3 g/dL (calc) (ref 1.9–3.7)
Glucose, Bld: 89 mg/dL (ref 65–139)
Potassium: 4.3 mmol/L (ref 3.5–5.3)
Sodium: 133 mmol/L — ABNORMAL LOW (ref 135–146)
Total Bilirubin: 3.2 mg/dL — ABNORMAL HIGH (ref 0.2–1.2)
Total Protein: 6 g/dL — ABNORMAL LOW (ref 6.1–8.1)

## 2020-04-03 LAB — PROTIME-INR
INR: 1.3 — ABNORMAL HIGH
Prothrombin Time: 13.5 s — ABNORMAL HIGH (ref 9.0–11.5)

## 2020-04-03 LAB — AMMONIA: Ammonia: 108 umol/L — ABNORMAL HIGH (ref <=72)

## 2020-04-06 DIAGNOSIS — J40 Bronchitis, not specified as acute or chronic: Secondary | ICD-10-CM | POA: Insufficient documentation

## 2020-04-06 NOTE — Assessment & Plan Note (Signed)
BP Readings from Last 1 Encounters:  04/02/20 128/73   Well-controlled with Lasix and spironolactone (mainly for cirrhosis) Counseled for compliance with the medications Advised low salt diet and moderate exercise/walking as tolerated

## 2020-04-06 NOTE — Assessment & Plan Note (Signed)
Takes PRN Ibuprofen and Spironolactone

## 2020-04-06 NOTE — Assessment & Plan Note (Addendum)
Follows up with GI On diuretics and Rifaximin H/o ascites s/p paracentesis, gets paracentesis PRN

## 2020-04-06 NOTE — Assessment & Plan Note (Signed)
Cough and congestion for last few weeks Prescribed Azithromycin If persistent, will add steroids

## 2020-04-06 NOTE — Assessment & Plan Note (Signed)
On Omeprazole 

## 2020-04-06 NOTE — Progress Notes (Signed)
New Patient Office Visit  Subjective:  Patient ID: Marcus Pham, male    DOB: [birth year]  Age: 70 y.o. MRN: [id number]  CC:  Chief Complaint  Patient presents with  . New Patient (Initial Visit)    New Patient former [facility] pt just establishing care     HPI Marcus Pham is a 70 year old male with PMH of HTN, hepatic cirrhosis, ascites, chronic low back pain and PTSD who presents for establishing care.  He has a h/o hepatic cirrhosis and follows up with Dr. Abbey Chatters. He has had multiple paracentesis for ascites. Denies any extensive abdominal distension currently. He is on Lasix and Spironolactone.  He c/o cough and nasal congestion for last few weeks, but denies any fever, chills, sore throat. Of note, he has h/o seasonal allergies, but states that this episode seems different and has lasted longer than usual.  He is up-to-date with COVID vaccine.  Past Medical History:  Diagnosis Date  . Anxiety   . Arthritis    back and hands  . Depression    PTSD  . Encephalopathy, hepatic ([place]) 12/15/2019  . GERD (gastroesophageal reflux disease)   . Hyperlipidemia   . Hypertension   . Substance abuse ([place])    patient denies but says he drinks up to 12 beers a day  . Thyroid disease     Past Surgical History:  Procedure Laterality Date  . 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 ESOPHAGOGASTRODUODENOSCOPY (EGD) WITH PROPOFOL N/A 01/10/2020   Procedure: ESOPHAGOGASTRODUODENOSCOPY (EGD) WITH PROPOFOL;  Surgeon: Eloise Harman, DO;  [facility];  Service: Endoscopy;  Laterality: N/A;  2:15pm  . ROTATOR CUFF REPAIR Left    2002-2003?  . TONSILLECTOMY     as a child    Family History  Problem Relation Age of Onset  . Hypertension Mother   . Arthritis Mother   . Heart disease Father 68       CHF  . Alcohol abuse Brother        substance abuse, died of trauma  . Heart disease Paternal Grandfather 36       MI    Social History   Socioeconomic  History  . Marital status: Married    Spouse name: Not on file  . Number of children: Not on file  . Years of education: Not on file  . Highest education level: Not on file  Occupational History  . Not on file  Tobacco Use  . Smoking status: Former Research scientist (life sciences)  . Smokeless tobacco: Never Used  . Tobacco comment: quit 40 years ago   Vaping Use  . Vaping Use: Never used  Substance and Sexual Activity  . Alcohol use: Not Currently    Alcohol/week: 4.0 - 5.0 standard drinks    Types: 4 - 5 Cans of beer per week  . Drug use: Not Currently    Types: Marijuana    Comment: occasional marijuana use  . Sexual activity: Yes  Other Topics Concern  . Not on file  Social History Narrative  . Not on file   Social Determinants of Health   Financial Resource Strain: Not on file  Food Insecurity: Not on file  Transportation Needs: Not on file  Physical Activity: Not on file  Stress: Not on file  Social Connections: Not on file  Intimate Partner Violence: Not on file    ROS Review of Systems  Constitutional: Negative for chills and fever.  HENT: Positive  for congestion and sinus pressure. Negative for sore throat.   Eyes: Negative for pain and discharge.  Respiratory: Positive for cough. Negative for shortness of breath.   Cardiovascular: Negative for chest pain and palpitations.  Gastrointestinal: Positive for abdominal distention and nausea. Negative for constipation, diarrhea and vomiting.  Endocrine: Negative for polydipsia and polyuria.  Genitourinary: Negative for dysuria and hematuria.  Musculoskeletal: Negative for neck pain and neck stiffness.  Skin: Negative for rash.  Neurological: Negative for dizziness, weakness, numbness and headaches.  Psychiatric/Behavioral: Negative for agitation and behavioral problems.    Objective:   Today's Vitals: BP 128/73 (BP Location: Left Arm, Patient Position: Sitting, Cuff Size: Normal)   Pulse 77   Resp 18   Ht 6\' 1"  (1.854 m)   Wt 212  lb 1.9 oz (96.2 kg)   SpO2 95%   BMI 27.99 kg/m   Physical Exam Vitals reviewed.  Constitutional:      General: He is not in acute distress.    Appearance: He is not diaphoretic.  HENT:     Head: Normocephalic and atraumatic.     Nose: Nose normal.     Mouth/Throat:     Mouth: Mucous membranes are moist.  Eyes:     General: No scleral icterus.    Extraocular Movements: Extraocular movements intact.     Pupils: Pupils are equal, round, and reactive to light.  Cardiovascular:     Rate and Rhythm: Normal rate and regular rhythm.     Pulses: Normal pulses.     Heart sounds: Normal heart sounds. No murmur heard.   Pulmonary:     Breath sounds: Normal breath sounds. No wheezing or rales.  Abdominal:     Palpations: Abdomen is soft.     Tenderness: There is no abdominal tenderness.  Musculoskeletal:     Cervical back: Neck supple. No tenderness.     Right lower leg: No edema.     Left lower leg: No edema.  Skin:    General: Skin is warm.     Findings: No rash.  Neurological:     General: No focal deficit present.     Mental Status: He is alert and oriented to person, place, and time.  Psychiatric:        Mood and Affect: Mood normal.        Behavior: Behavior normal.     Assessment & Plan:   Problem List Items Addressed This Visit      Encounter to establish care Care established History and medications reviewed with the patient    Cardiovascular and Mediastinum   Essential hypertension - Primary    BP Readings from Last 1 Encounters:  04/02/20 128/73   Well-controlled with Lasix and spironolactone (mainly for cirrhosis) Counseled for compliance with the medications Advised low salt diet and moderate exercise/walking as tolerated        Respiratory   Bronchitis    Cough and congestion for last few weeks Prescribed Azithromycin If persistent, will add steroids      Relevant Medications   azithromycin (ZITHROMAX) 250 MG tablet   benzonatate (TESSALON)  100 MG capsule     Digestive   GERD (gastroesophageal reflux disease)    On Omeprazole      Relevant Medications   ondansetron (ZOFRAN) 4 MG tablet   Hepatic cirrhosis ([place])    Follows up with GI On diuretics and Rifaximin H/o ascites s/p paracentesis, gets paracentesis PRN        Other  Low back pain    Takes PRN Ibuprofen and Spironolactone      Relevant Medications   cyclobenzaprine (FLEXERIL) 10 MG tablet        Outpatient Encounter Medications as of 04/02/2020  Medication Sig  . azithromycin (ZITHROMAX) 250 MG tablet Take as package instructions.  . benzonatate (TESSALON) 100 MG capsule Take 1 capsule (100 mg total) by mouth 2 (two) times daily as needed for cough.  . cyclobenzaprine (FLEXERIL) 10 MG tablet TAKE ONE TABLET BY MOUTH 3 TIMES A DAY AS NEEDED FOR MUSCLE SPASMS. USE SPARINGLY.  . furosemide (LASIX) 40 MG tablet Take 1 tablet (40 mg total) by mouth daily.  Marland Kitchen ibuprofen (ADVIL,MOTRIN) 800 MG tablet Take 800 mg by mouth as needed for moderate pain.  Marland Kitchen lactulose (CHRONULAC) 10 GM/15ML solution Take 30 mLs (20 g total) by mouth 3 (three) times daily. Titrate up to 2-3 loose BMs daily (Patient taking differently: Take 20 g by mouth as needed for moderate constipation. Titrate up to 2-3 loose BMs daily)  . omeprazole (PRILOSEC) 20 MG capsule Take 20 mg by mouth daily as needed (acid reflux).  . ondansetron (ZOFRAN) 4 MG tablet Take by mouth.  . rifaximin (XIFAXAN) 550 MG TABS tablet Take 1 tablet (550 mg total) by mouth 2 (two) times daily.  Marland Kitchen spironolactone (ALDACTONE) 100 MG tablet Take 1 tablet (100 mg total) by mouth daily.  . [DISCONTINUED] methocarbamol (ROBAXIN) 750 MG tablet Take 750 mg by mouth as needed for muscle spasms. (Patient not taking: Reported on 04/02/2020)   No facility-administered encounter medications on file as of 04/02/2020.    Follow-up: Return in about 6 months (around 10/03/2020).   [name], MD

## 2020-04-11 ENCOUNTER — Encounter: Payer: Self-pay | Admitting: Internal Medicine

## 2020-04-11 ENCOUNTER — Ambulatory Visit (INDEPENDENT_AMBULATORY_CARE_PROVIDER_SITE_OTHER): Payer: No Typology Code available for payment source | Admitting: Internal Medicine

## 2020-04-11 ENCOUNTER — Other Ambulatory Visit: Payer: Self-pay

## 2020-04-11 VITALS — BP 134/78 | HR 73 | Temp 97.1°F | Ht 73.0 in | Wt 215.0 lb

## 2020-04-11 DIAGNOSIS — K7460 Unspecified cirrhosis of liver: Secondary | ICD-10-CM

## 2020-04-11 DIAGNOSIS — K766 Portal hypertension: Secondary | ICD-10-CM | POA: Diagnosis not present

## 2020-04-11 DIAGNOSIS — K219 Gastro-esophageal reflux disease without esophagitis: Secondary | ICD-10-CM | POA: Diagnosis not present

## 2020-04-11 DIAGNOSIS — R1084 Generalized abdominal pain: Secondary | ICD-10-CM

## 2020-04-11 DIAGNOSIS — K7031 Alcoholic cirrhosis of liver with ascites: Secondary | ICD-10-CM

## 2020-04-11 MED ORDER — LACTULOSE 10 GM/15ML PO SOLN
20.0000 g | Freq: Three times a day (TID) | ORAL | 5 refills | Status: DC
Start: 2020-04-11 — End: 2020-04-26

## 2020-04-11 MED ORDER — RIFAXIMIN 550 MG PO TABS: 550.0000 mg | ORAL_TABLET | Freq: Two times a day (BID) | ORAL | 3 refills | Status: DC

## 2020-04-11 MED ORDER — FUROSEMIDE 40 MG PO TABS
40.0000 mg | ORAL_TABLET | Freq: Every day | ORAL | 3 refills | Status: DC
Start: 2020-04-11 — End: 2020-04-26

## 2020-04-11 MED ORDER — SPIRONOLACTONE 100 MG PO TABS: 100.0000 mg | ORAL_TABLET | Freq: Every day | ORAL | 3 refills | Status: DC

## 2020-04-11 NOTE — Patient Instructions (Signed)
I am happy to hear that you are doing well.  For your swelling, continue on furosemide and spironolactone.  If you notice increased swelling you can double both doses for up to 3 days to help with this.  If swelling continues then please give [place] a call.  Continue on Xifaxan and lactulose for hepatic encephalopathy.  I refilled both these medications today.  We will plan on repeat ultrasound before your next office visit.  I will also perform blood work at that time as well.  Follow-up in 3 months or sooner if needed.  At [facility] we value your feedback. You may receive a survey about your visit today. Please share your experience as we strive to create trusting relationships with our patients to provide genuine, compassionate, quality care.  We appreciate your understanding and patience as we review any laboratory studies, imaging, and other diagnostic tests that are ordered as we care for you. Our office policy is 5 business days for review of these results, and any emergent or urgent results are addressed in a timely manner for your best interest. If you do not hear from our office in 1 week, please contact us.   We also encourage the use of MyChart, which contains your medical information for your review as well. If you are not enrolled in this feature, an access code is on this after visit summary for your convenience. Thank you for allowing [place] to be involved in your care.  It was great to see you today!  I hope you have a great rest of your spring!!    Elon Alas. Abbey Chatters, D.O. Gastroenterology and Hepatology [facility]

## 2020-04-11 NOTE — Progress Notes (Signed)
Referring Provider: Denyce Robert, FNP Primary Care Physician:  [name], MD Primary GI:  Dr. Abbey Chatters  Chief Complaint  Patient presents with  . Follow-up    Fu ascites, swelling in ankles x 2 weeks    HPI:   Marcus Pham is a 70 y.o. male who presents for follow-up visit.   Upon initial consultation in October 2021 for elevated LFTs, I ordered blood work to be performed including full serological work-up but patient was confused and did not have this done. I ordered an ultrasound with elastography and patient did show up for this. I received a call from radiology stating that patient had large amount of ascites and test could not be performed. He subsequently underwent paracentesis with 4 L of ascitic fluid removed. Fluid albumin was reported as less than 1, serum albumin 2.8 indicating a SAAG of greater than 1.1, fluid protein was also low indicating that patient's ascites is likely from portal hypertension. Subsequent ultrasound showed cirrhotic appearing liver, elastography with significantly elevated median kPaof 37.8.  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  Serological work-up negative for other causes.Patient notes previous alcohol abuse drinking 6-8 beers at least daily for many years.He stopped drinking early November 2021.   His daughter [name] and wife [name] accompany him today.   Patient has history of hepatic encephalopathy which is maintained on Xifaxan as well as lactulose.  Patient endorses not taking the lactulose every day.  Patient's daughter does note that since being on the Xifaxan he does seem to have more good days than bad days.  Appears more lucid.  For hypervolemia he is maintained on Lasix 40 mg and spironolactone 100 mg daily.  Denies any abdominal distention for me today.  States he is avoiding salt best he can. Did require paracentesis a few weeks ago with 2.2L removed.  Patient underwent EGD  01/10/2020 for variceal screening without evidence of esophageal varices.  Ultrasound 12/06/2019 without evidence of HCC.  Most recent MELD NA 17.   Past Medical History:  Diagnosis Date  . Anxiety   . Arthritis    back and hands  . Depression    PTSD  . Encephalopathy, hepatic ([place]) 12/15/2019  . GERD (gastroesophageal reflux disease)   . Hyperlipidemia   . Hypertension   . Substance abuse ([place])    patient denies but says he drinks up to 12 beers a day  . Thyroid disease     Past Surgical History:  Procedure Laterality Date  . 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 ESOPHAGOGASTRODUODENOSCOPY (EGD) WITH PROPOFOL N/A 01/10/2020   Procedure: ESOPHAGOGASTRODUODENOSCOPY (EGD) WITH PROPOFOL;  Surgeon: Eloise Harman, DO;  [facility];  Service: Endoscopy;  Laterality: N/A;  2:15pm  . ROTATOR CUFF REPAIR Left    2002-2003?  . TONSILLECTOMY     as a child    Current Outpatient Medications  Medication Sig Dispense Refill  . benzonatate (TESSALON) 100 MG capsule Take 1 capsule (100 mg total) by mouth 2 (two) times daily as needed for cough. (Patient taking differently: Take 100 mg by mouth as needed for cough.) 20 capsule 0  . cyclobenzaprine (FLEXERIL) 10 MG tablet as needed.    Marland Kitchen ibuprofen (ADVIL,MOTRIN) 800 MG tablet Take 800 mg by mouth as needed for moderate pain.    Marland Kitchen omeprazole (PRILOSEC) 20 MG capsule Take 20 mg by mouth daily as needed (acid reflux).    . ondansetron (ZOFRAN) 4  MG tablet Take by mouth as needed.    Marland Kitchen azithromycin (ZITHROMAX) 250 MG tablet Take as package instructions. (Patient not taking: Reported on 04/11/2020) 6 tablet 0  . furosemide (LASIX) 40 MG tablet Take 1 tablet (40 mg total) by mouth daily. 90 tablet 3  . lactulose (CHRONULAC) 10 GM/15ML solution Take 30 mLs (20 g total) by mouth 3 (three) times daily. Titrate up to 2-3 loose BMs daily 2700 mL 5  . rifaximin (XIFAXAN) 550 MG TABS tablet Take 1 tablet (550 mg total) by mouth 2 (two)  times daily. 180 tablet 3  . spironolactone (ALDACTONE) 100 MG tablet Take 1 tablet (100 mg total) by mouth daily. 90 tablet 3   No current facility-administered medications for this visit.    Allergies as of 04/11/2020  . (No Known Allergies)    Family History  Problem Relation Age of Onset  . Hypertension Mother   . Arthritis Mother   . Heart disease Father 68       CHF  . Alcohol abuse Brother        substance abuse, died of trauma  . Heart disease Paternal Grandfather 32       MI    Social History   Socioeconomic History  . Marital status: Married    Spouse name: Not on file  . Number of children: Not on file  . Years of education: Not on file  . Highest education level: Not on file  Occupational History  . Not on file  Tobacco Use  . Smoking status: Former Research scientist (life sciences)  . Smokeless tobacco: Never Used  . Tobacco comment: quit 40 years ago   Vaping Use  . Vaping Use: Never used  Substance and Sexual Activity  . Alcohol use: Not Currently    Alcohol/week: 4.0 - 5.0 standard drinks    Types: 4 - 5 Cans of beer per week  . Drug use: Not Currently    Types: Marijuana    Comment: occasional marijuana use  . Sexual activity: Yes  Other Topics Concern  . Not on file  Social History Narrative  . Not on file   Social Determinants of Health   Financial Resource Strain: Not on file  Food Insecurity: Not on file  Transportation Needs: Not on file  Physical Activity: Not on file  Stress: Not on file  Social Connections: Not on file    Subjective: Review of Systems  Constitutional: Positive for malaise/fatigue. Negative for chills and fever.  HENT: Negative for congestion and hearing loss.   Eyes: Negative for blurred vision and double vision.  Respiratory: Negative for cough and shortness of breath.   Cardiovascular: Positive for leg swelling. Negative for chest pain and palpitations.  Gastrointestinal: Negative for abdominal pain, blood in stool, constipation,  diarrhea, heartburn, melena and vomiting.  Genitourinary: Negative for dysuria and urgency.  Musculoskeletal: Negative for joint pain and myalgias.  Skin: Negative for itching and rash.  Neurological: Negative for dizziness and headaches.  Psychiatric/Behavioral: Negative for depression. The patient is not nervous/anxious.      Objective: BP 134/78   Pulse 73   Temp (!) 97.1 F (36.2 C) (Temporal)   Ht 6\' 1"  (1.854 m)   Wt 215 lb (97.5 kg)   BMI 28.37 kg/m  Physical Exam Constitutional:      Appearance: Normal appearance.  HENT:     Head: Normocephalic and atraumatic.  Eyes:     Extraocular Movements: Extraocular movements intact.     Conjunctiva/sclera: Conjunctivae  normal.  Cardiovascular:     Rate and Rhythm: Normal rate and regular rhythm.  Pulmonary:     Effort: Pulmonary effort is normal.     Breath sounds: Normal breath sounds.  Abdominal:     General: Bowel sounds are normal.     Palpations: Abdomen is soft.  Musculoskeletal:        General: Normal range of motion.     Cervical back: Normal range of motion and neck supple.  Skin:    General: Skin is warm.     Coloration: Skin is jaundiced.  Neurological:     General: No focal deficit present.     Mental Status: He is alert and oriented to person, place, and time.  Psychiatric:        Mood and Affect: Mood normal.        Behavior: Behavior normal.      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 I also discussed complications associated and treatment strategies as well.  -Etiology of patient's decompensated cirrhosis likely due to alcohol.  Serological work-up negative for other causes.  -Most recent MELD 17 though bili is improving  -Patient appears euvolemic in office today.   Continue on Lasix 40 mg daily and Aldactone 100 mg daily.    -I counseled patient and daughter on the vast importance of a low-sodium diet, less than 2000 mg daily. Also counseled on ensuring that patient gets enough protein in his diet to reduce risk of muscle wasting.  -Hepatic encephalopathy appears to be improved on Xifaxan.  We will continue.  Patient counseled to take lactulose as well especially if he is feeling more fatigued.Counseled patient on titrating up his lactulose to ensure at least 2-3 loose bowel movements daily.  -EGD 01/10/2020 for variceal screening without evidence of esophageal varices.  Repeat 12/2021 or sooner if further decompensation.  -Ultrasound 12/06/2019 without evidence of HCC. Repeat 06/01/2020.  -Patient needs Hepatitis A and B vaccines, will send script to pharmacy today.  Discussed long-term prognosis of decompensated cirrhosis with patient and daughter today. He has elevated risk of mortality given decompensated nature of his liver disease. I did counsel on the vast importance of alcohol cessation as we do see some regression in alcoholic induced cirrhosis with sobriety. Discussed liver transplant with patient and daughter today. I did note the patient has multiple obstacles to this including age and comorbidities. We will work to get complications from cirrhosis under control.  Likely refer to hepatology in the future.  Patient to follow-up in 3 months or sooner if needed. He will have blood work and US done before.   04/11/2020 12:10 PM   Disclaimer: This note was dictated with voice recognition software. Similar sounding words can inadvertently be transcribed and may not be corrected upon review.

## 2020-04-20 ENCOUNTER — Telehealth: Payer: Self-pay

## 2020-04-20 NOTE — Telephone Encounter (Signed)
error 

## 2020-04-20 NOTE — Telephone Encounter (Signed)
Patient calling he has questions about a cough medication that was prescribed and questions about hep a and b vaccines ph# [phone]

## 2020-04-23 ENCOUNTER — Other Ambulatory Visit: Payer: Self-pay

## 2020-04-23 ENCOUNTER — Telehealth: Payer: Self-pay | Admitting: Internal Medicine

## 2020-04-23 ENCOUNTER — Encounter: Payer: Self-pay | Admitting: Nurse Practitioner

## 2020-04-23 ENCOUNTER — Telehealth (INDEPENDENT_AMBULATORY_CARE_PROVIDER_SITE_OTHER): Payer: No Typology Code available for payment source | Admitting: Nurse Practitioner

## 2020-04-23 DIAGNOSIS — R059 Cough, unspecified: Secondary | ICD-10-CM

## 2020-04-23 NOTE — Assessment & Plan Note (Signed)
-  has been ongoing for 3 weeks -he took a z-pack and tessalon, but he still has some good days and bad days with cough -no fatigue, fever, chills, or itchy/runny nose; so doubtful infectious or allergy etiology -not currently taking ACEi -may have cough related to GERD, and he has just been taking omeprazole about once per week -take omeprazole daily for a week, and we will see if that improves -will get CXR ordered today as well -he is being treated for cirrhosis, ? Esophageal irritation from varices

## 2020-04-23 NOTE — Telephone Encounter (Signed)
Pt's daughter, [name], called with questions for the nurse about Hep A and B shots. Please call [phone]

## 2020-04-23 NOTE — Telephone Encounter (Signed)
Spoke with pts daughter. She I aware that since pts PCP doesn't do Hep A & B Vaccinations, she can call the Health Department or local pharmacy for vaccinations.   Dena, pts daughter wants to know if the [place] has contacted our office? Dr. Abbey Chatters sent in 4 RX on 04/11/2020 (Spironolactone, Lasix, Xifaxan and Zofran) pt hasn't heard from the [place] about his medications and they were inquiring about it.

## 2020-04-23 NOTE — Progress Notes (Signed)
Acute Office Visit  Subjective:    Patient ID: Marcus Pham, male    DOB: 05-06-1950, 70 y.o.   MRN: [id number]  Chief Complaint  Patient presents with  . Cough    X 10 days; dry, hacking     HPI Patient is in today for cough that has been ongoing x 10 days. He was prescribed azithromycin and tessalon on 04/02/20.  Doesn't take ACEi or ARB.  He states he had a few days without  Cough, but some days it is a lot worse.  Past Medical History:  Diagnosis Date  . Anxiety   . Arthritis    back and hands  . Depression    PTSD  . Encephalopathy, hepatic ([place]) 12/15/2019  . GERD (gastroesophageal reflux disease)   . Hyperlipidemia   . Hypertension   . Substance abuse ([place])    patient denies but says he drinks up to 12 beers a day  . Thyroid disease     Past Surgical History:  Procedure Laterality Date  . 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 ESOPHAGOGASTRODUODENOSCOPY (EGD) WITH PROPOFOL N/A 01/10/2020   Procedure: ESOPHAGOGASTRODUODENOSCOPY (EGD) WITH PROPOFOL;  Surgeon: Eloise Harman, DO;  [facility];  Service: Endoscopy;  Laterality: N/A;  2:15pm  . ROTATOR CUFF REPAIR Left    2002-2003?  . TONSILLECTOMY     as a child    Family History  Problem Relation Age of Onset  . Hypertension Mother   . Arthritis Mother   . Heart disease Father 20       CHF  . Alcohol abuse Brother        substance abuse, died of trauma  . Heart disease Paternal Grandfather 75       MI    Social History   Socioeconomic History  . Marital status: Married    Spouse name: Not on file  . Number of children: Not on file  . Years of education: Not on file  . Highest education level: Not on file  Occupational History  . Not on file  Tobacco Use  . Smoking status: Former Research scientist (life sciences)  . Smokeless tobacco: Never Used  . Tobacco comment: quit 40 years ago   Vaping Use  . Vaping Use: Never used  Substance and Sexual Activity  . Alcohol use: Not Currently    Alcohol/week:  4.0 - 5.0 standard drinks    Types: 4 - 5 Cans of beer per week  . Drug use: Not Currently    Types: Marijuana    Comment: occasional marijuana use  . Sexual activity: Yes  Other Topics Concern  . Not on file  Social History Narrative  . Not on file   Social Determinants of Health   Financial Resource Strain: Not on file  Food Insecurity: Not on file  Transportation Needs: Not on file  Physical Activity: Not on file  Stress: Not on file  Social Connections: Not on file  Intimate Partner Violence: Not on file    Outpatient Medications Prior to Visit  Medication Sig Dispense Refill  . cyclobenzaprine (FLEXERIL) 10 MG tablet as needed.    . furosemide (LASIX) 40 MG tablet Take 1 tablet (40 mg total) by mouth daily. 90 tablet 3  . ibuprofen (ADVIL,MOTRIN) 800 MG tablet Take 800 mg by mouth as needed for moderate pain.    Marland Kitchen lactulose (CHRONULAC) 10 GM/15ML solution Take 30 mLs (20 g total) by mouth 3 (three) times daily.  Titrate up to 2-3 loose BMs daily 2700 mL 5  . omeprazole (PRILOSEC) 20 MG capsule Take 20 mg by mouth daily as needed (acid reflux).    . ondansetron (ZOFRAN) 4 MG tablet Take by mouth as needed.    . rifaximin (XIFAXAN) 550 MG TABS tablet Take 1 tablet (550 mg total) by mouth 2 (two) times daily. 180 tablet 3  . spironolactone (ALDACTONE) 100 MG tablet Take 1 tablet (100 mg total) by mouth daily. 90 tablet 3  . azithromycin (ZITHROMAX) 250 MG tablet Take as package instructions. (Patient not taking: Reported on 04/23/2020) 6 tablet 0  . benzonatate (TESSALON) 100 MG capsule Take 1 capsule (100 mg total) by mouth 2 (two) times daily as needed for cough. (Patient not taking: Reported on 04/23/2020) 20 capsule 0   No facility-administered medications prior to visit.    No Known Allergies  Review of Systems  Constitutional: Negative.   HENT: Negative for congestion, rhinorrhea, sinus pressure, sinus pain and sore throat.   Eyes: Negative.   Respiratory: Positive  for cough. Negative for chest tightness, shortness of breath and wheezing.        Objective:    Physical Exam  There were no vitals taken for this visit. Wt Readings from Last 3 Encounters:  04/11/20 215 lb (97.5 kg)  04/02/20 212 lb 1.9 oz (96.2 kg)  01/18/20 203 lb (92.1 kg)    Health Maintenance Due  Topic Date Due  . COLONOSCOPY (Pts 45-38yrs Insurance coverage will need to be confirmed)  Never done  . PNA vac Low Risk Adult (2 of 2 - PPSV23) 10/08/2016    There are no preventive care reminders to display for this patient.   No results found for: TSH No results found for: WBC, HGB, HCT, MCV, PLT Lab Results  Component Value Date   NA 133 (L) 04/02/2020   K 4.3 04/02/2020   CO2 25 04/02/2020   GLUCOSE 89 04/02/2020   BUN 13 04/02/2020   CREATININE 1.03 04/02/2020   BILITOT 3.2 (H) 04/02/2020   AST 50 (H) 04/02/2020   ALT 27 04/02/2020   PROT 6.0 (L) 04/02/2020   CALCIUM 8.4 (L) 04/02/2020   No results found for: CHOL No results found for: HDL No results found for: LDLCALC No results found for: TRIG No results found for: CHOLHDL No results found for: HGBA1C     Assessment & Plan:   Problem List Items Addressed This Visit      Other   Cough - Primary    -has been ongoing for 3 weeks -he took a z-pack and tessalon, but he still has some good days and bad days with cough -no fatigue, fever, chills, or itchy/runny nose; so doubtful infectious or allergy etiology -not currently taking ACEi -may have cough related to GERD, and he has just been taking omeprazole about once per week -take omeprazole daily for a week, and we will see if that improves -will get CXR ordered today as well -he is being treated for cirrhosis, ? Esophageal irritation from varices      Relevant Orders   DG Chest 2 View       No orders of the defined types were placed in this encounter.  Date:  04/23/2020   Location of Patient: Home Location of Provider: Office Consent was  obtain for visit to be over via telehealth. I verified that I am speaking with the correct person using two identifiers.  I connected with  Marcus Pham  on 04/23/20 via telephone and verified that I am speaking with the correct person using two identifiers.   I discussed the limitations of evaluation and management by telemedicine. The patient expressed understanding and agreed to proceed.  Time spent: 9 minutes   Noreene Larsson, NP

## 2020-04-25 ENCOUNTER — Other Ambulatory Visit: Payer: Self-pay

## 2020-04-25 ENCOUNTER — Ambulatory Visit (HOSPITAL_COMMUNITY)
Admission: RE | Admit: 2020-04-25 | Discharge: 2020-04-25 | Disposition: A | Discharge status: Home / Self Care | Payer: No Typology Code available for payment source | Source: Ambulatory Visit | Attending: Nurse Practitioner | Admitting: Nurse Practitioner

## 2020-04-25 DIAGNOSIS — R059 Cough, unspecified: Secondary | ICD-10-CM | POA: Diagnosis present

## 2020-04-25 NOTE — Telephone Encounter (Signed)
Phoned the pt's daughter back Veterinary surgeon) advised her we had given her the written Rx's needed (which she agreed to) and also the pt has to go to the [place] to have his [facility] Vacc[facility]. [facility]. Advised her if need be she needs to fax or take her fathers Rx's to the [place]. She stated she could fax from her place of work.

## 2020-04-26 ENCOUNTER — Other Ambulatory Visit: Payer: Self-pay | Admitting: Nurse Practitioner

## 2020-04-26 ENCOUNTER — Telehealth: Payer: Self-pay

## 2020-04-26 ENCOUNTER — Telehealth: Payer: Self-pay | Admitting: Internal Medicine

## 2020-04-26 ENCOUNTER — Other Ambulatory Visit: Payer: Self-pay

## 2020-04-26 MED ORDER — SPIRONOLACTONE 100 MG PO TABS: 100.0000 mg | ORAL_TABLET | Freq: Every day | ORAL | 3 refills | Status: AC

## 2020-04-26 MED ORDER — LACTULOSE 10 GM/15ML PO SOLN: 20.0000 g | Freq: Three times a day (TID) | ORAL | 5 refills | Status: DC

## 2020-04-26 MED ORDER — RIFAXIMIN 550 MG PO TABS: 550.0000 mg | ORAL_TABLET | Freq: Two times a day (BID) | ORAL | 3 refills | Status: AC

## 2020-04-26 MED ORDER — AMOXICILLIN-POT CLAVULANATE 875-125 MG PO TABS: 1.0000 | ORAL_TABLET | Freq: Two times a day (BID) | ORAL | 0 refills | Status: DC

## 2020-04-26 MED ORDER — FUROSEMIDE 40 MG PO TABS
40.0000 mg | ORAL_TABLET | Freq: Every day | ORAL | 3 refills | Status: AC
Start: 2020-04-26 — End: 2021-04-26

## 2020-04-26 NOTE — Progress Notes (Signed)
CXR was negative for mass or lesion.  He tried azithromycin in the past.  The imaging did show bronchitis vs PNA, so I will send in another antibiotic and we  will see if that helps.  If this doesn't help, we may have to look at some inhalers to see if they help.  Give the abx a week to see if they help.

## 2020-04-26 NOTE — Telephone Encounter (Signed)
Dr. Luan Moore from the [facility] called yesterday and this morning regarding the pt's Rx for medications and Hep. A & B Vaccines. The pt's daughter faxed the Rx's but they could not read because void was written all over the pt's information. They need for you to send e-scribe to [facility] or I can fax along with the Hep. A/B vaccines. She didn't turn those in either. I have the fax if you want to write out for the pt.

## 2020-04-27 ENCOUNTER — Telehealth: Payer: Self-pay

## 2020-04-27 NOTE — Telephone Encounter (Signed)
Faxed the pt's 4 Rx's and Hepatitis A&B Vaccination script to [facility] (PACT TEAM 4) because they could not read the copies of Rx's the daughter had sent in.

## 2020-05-01 ENCOUNTER — Telehealth: Payer: Self-pay | Admitting: Internal Medicine

## 2020-05-01 NOTE — Telephone Encounter (Signed)
Pt's daughter, [name], called asking to speak to nurse regarding prescriptions. Please call [phone]

## 2020-05-03 NOTE — Telephone Encounter (Signed)
Pt was seen in the office.

## 2020-05-08 ENCOUNTER — Encounter: Payer: Self-pay | Admitting: Internal Medicine

## 2020-05-14 NOTE — Telephone Encounter (Signed)
error 

## 2020-06-19 ENCOUNTER — Telehealth: Payer: Self-pay

## 2020-06-19 NOTE — Telephone Encounter (Signed)
Pt's daughter [name] phoned to get your opinion on what she needs to do concerning her father. Over the last week she has noticed a change with swelling in his feet, his abd is tight and looks full and around his belly button it is blue and protruding. Pt not sleeping some nights. He is having a lot of confusion @ times. He went and got his 2nd Hep. B shot yesterday and all he has been doing is sleeping during the day. States the pt has no energy. He is also showing personality changes. He became very aggravated with her yesterday and hung up on her (which he doesn't remember today). Pt also is not taking his Lactulose. Pt's daughter would like to know what she should do from this stand point of her father.

## 2020-06-21 ENCOUNTER — Telehealth: Payer: Self-pay | Admitting: Internal Medicine

## 2020-06-21 DIAGNOSIS — R5383 Other fatigue: Secondary | ICD-10-CM

## 2020-06-21 DIAGNOSIS — K7031 Alcoholic cirrhosis of liver with ascites: Secondary | ICD-10-CM

## 2020-06-21 DIAGNOSIS — R531 Weakness: Secondary | ICD-10-CM

## 2020-06-21 NOTE — Telephone Encounter (Signed)
Returned call back from pt's daughter left on vm. I advised her that the bloodwork was already in the system and she can take her dad and that Tretha Sciara was waiting on further instructions from Dr. Abbey Chatters. She advised me that Tretha Sciara had called her as well.

## 2020-06-21 NOTE — Telephone Encounter (Signed)
Called and spoke with patient's daughter.  Patient with abdominal swelling as well as increased generalized weakness/fatigue, some confusion.  No bleeding.   Can we please order paracentesis for patient? I will also check CBC, CMP, AFP, ammonia, INR which I will order.  Patient told to go to [place] labs to have this drawn.  Thanks

## 2020-06-21 NOTE — Telephone Encounter (Signed)
noted 

## 2020-06-21 NOTE — Telephone Encounter (Signed)
Dr. Abbey Chatters, do you want paracentesis as ordered previously? Last para was maximum of 5 liters to be drawn off; labs-body fluid cell count with diff, body fluid culture and gram stain. Albumin per protocol.

## 2020-06-22 ENCOUNTER — Other Ambulatory Visit: Payer: Self-pay

## 2020-06-22 DIAGNOSIS — K7031 Alcoholic cirrhosis of liver with ascites: Secondary | ICD-10-CM

## 2020-06-22 NOTE — Telephone Encounter (Signed)
Callie at pre-service center [facility] asking if pt will be filing [employer] or [employer] for paracentesis. Ext [id number].  VA Josem Kaufmann is scanned into chart. Paracentesis is included on authorization. Auth# [id number], expires 10/18/20.  Tried to call Callie back, LMOVM to inform her paracentesis is covered by [place] auth and gave her auth# and expiration.

## 2020-06-22 NOTE — Telephone Encounter (Signed)
Spoke to Dr. Abbey Chatters, he advised ok for para order 5 liters maximum to be drawn off, labs-body fluid cell count with diff, body fluid culture and gram stain. Albumin per protocol.  Para scheduled for 06/25/20 at 10:00am, arrive at 9:45am.  Tried to call daughter [name] to inform her of para appt, LMOVM for return call.

## 2020-06-22 NOTE — Telephone Encounter (Signed)
Spoke to daughter, [name], informed her of para appt.

## 2020-06-25 ENCOUNTER — Other Ambulatory Visit: Payer: Self-pay

## 2020-06-25 ENCOUNTER — Ambulatory Visit (HOSPITAL_COMMUNITY)
Admission: RE | Admit: 2020-06-25 | Discharge: 2020-06-25 | Disposition: A | Discharge status: Home / Self Care | Payer: PPO | Source: Ambulatory Visit | Attending: Internal Medicine | Admitting: Internal Medicine

## 2020-06-25 DIAGNOSIS — K7031 Alcoholic cirrhosis of liver with ascites: Secondary | ICD-10-CM

## 2020-06-25 DIAGNOSIS — K7460 Unspecified cirrhosis of liver: Secondary | ICD-10-CM | POA: Diagnosis not present

## 2020-06-25 DIAGNOSIS — R531 Weakness: Secondary | ICD-10-CM | POA: Diagnosis not present

## 2020-06-25 DIAGNOSIS — R5383 Other fatigue: Secondary | ICD-10-CM | POA: Diagnosis not present

## 2020-06-25 DIAGNOSIS — R188 Other ascites: Secondary | ICD-10-CM | POA: Diagnosis not present

## 2020-06-25 LAB — GRAM STAIN: Gram Stain: NONE SEEN

## 2020-06-25 LAB — BODY FLUID CELL COUNT WITH DIFFERENTIAL
Eos, Fluid: 0 %
Lymphs, Fluid: 17 %
Monocyte-Macrophage-Serous Fluid: 15 % — ABNORMAL LOW (ref 50–90)
Neutrophil Count, Fluid: 68 % — ABNORMAL HIGH (ref 0–25)
Total Nucleated Cell Count, Fluid: 843 cu mm (ref 0–1000)

## 2020-06-25 NOTE — Procedures (Signed)
   US guided RLQ paracentesis  3.3 L amber fluid Sent for labs per MD  Tolerated well  EBL: less than 1 cc

## 2020-06-26 ENCOUNTER — Telehealth: Payer: Self-pay | Admitting: Internal Medicine

## 2020-06-26 ENCOUNTER — Encounter (HOSPITAL_COMMUNITY): Payer: Self-pay | Admitting: Emergency Medicine

## 2020-06-26 ENCOUNTER — Other Ambulatory Visit: Payer: Self-pay

## 2020-06-26 ENCOUNTER — Observation Stay (HOSPITAL_COMMUNITY): Payer: No Typology Code available for payment source

## 2020-06-26 ENCOUNTER — Inpatient Hospital Stay (HOSPITAL_COMMUNITY)
Admission: EM | Admit: 2020-06-26 | Discharge: 2020-06-30 | DRG: 372 | Disposition: A | Discharge status: Home / Self Care | Payer: No Typology Code available for payment source | Attending: Internal Medicine | Admitting: Internal Medicine

## 2020-06-26 DIAGNOSIS — Z79899 Other long term (current) drug therapy: Secondary | ICD-10-CM

## 2020-06-26 DIAGNOSIS — M19041 Primary osteoarthritis, right hand: Secondary | ICD-10-CM | POA: Diagnosis present

## 2020-06-26 DIAGNOSIS — K7290 Hepatic failure, unspecified without coma: Secondary | ICD-10-CM | POA: Diagnosis present

## 2020-06-26 DIAGNOSIS — Z87891 Personal history of nicotine dependence: Secondary | ICD-10-CM

## 2020-06-26 DIAGNOSIS — K7682 Hepatic encephalopathy: Secondary | ICD-10-CM

## 2020-06-26 DIAGNOSIS — K7460 Unspecified cirrhosis of liver: Secondary | ICD-10-CM

## 2020-06-26 DIAGNOSIS — K429 Umbilical hernia without obstruction or gangrene: Secondary | ICD-10-CM | POA: Diagnosis present

## 2020-06-26 DIAGNOSIS — K652 Spontaneous bacterial peritonitis: Principal | ICD-10-CM | POA: Diagnosis present

## 2020-06-26 DIAGNOSIS — K766 Portal hypertension: Secondary | ICD-10-CM | POA: Diagnosis present

## 2020-06-26 DIAGNOSIS — E871 Hypo-osmolality and hyponatremia: Secondary | ICD-10-CM | POA: Diagnosis present

## 2020-06-26 DIAGNOSIS — D6959 Other secondary thrombocytopenia: Secondary | ICD-10-CM | POA: Diagnosis present

## 2020-06-26 DIAGNOSIS — K8021 Calculus of gallbladder without cholecystitis with obstruction: Secondary | ICD-10-CM | POA: Diagnosis present

## 2020-06-26 DIAGNOSIS — E785 Hyperlipidemia, unspecified: Secondary | ICD-10-CM | POA: Diagnosis present

## 2020-06-26 DIAGNOSIS — Z9111 Patient's noncompliance with dietary regimen: Secondary | ICD-10-CM

## 2020-06-26 DIAGNOSIS — R011 Cardiac murmur, unspecified: Secondary | ICD-10-CM | POA: Diagnosis present

## 2020-06-26 DIAGNOSIS — N1832 Chronic kidney disease, stage 3b: Secondary | ICD-10-CM | POA: Diagnosis present

## 2020-06-26 DIAGNOSIS — K7031 Alcoholic cirrhosis of liver with ascites: Secondary | ICD-10-CM | POA: Diagnosis not present

## 2020-06-26 DIAGNOSIS — D649 Anemia, unspecified: Secondary | ICD-10-CM

## 2020-06-26 DIAGNOSIS — R600 Localized edema: Secondary | ICD-10-CM | POA: Diagnosis present

## 2020-06-26 DIAGNOSIS — D631 Anemia in chronic kidney disease: Secondary | ICD-10-CM | POA: Diagnosis present

## 2020-06-26 DIAGNOSIS — Z8261 Family history of arthritis: Secondary | ICD-10-CM

## 2020-06-26 DIAGNOSIS — M479 Spondylosis, unspecified: Secondary | ICD-10-CM | POA: Diagnosis present

## 2020-06-26 DIAGNOSIS — I10 Essential (primary) hypertension: Secondary | ICD-10-CM | POA: Diagnosis not present

## 2020-06-26 DIAGNOSIS — N179 Acute kidney failure, unspecified: Secondary | ICD-10-CM

## 2020-06-26 DIAGNOSIS — K3189 Other diseases of stomach and duodenum: Secondary | ICD-10-CM | POA: Diagnosis present

## 2020-06-26 DIAGNOSIS — R059 Cough, unspecified: Secondary | ICD-10-CM

## 2020-06-26 DIAGNOSIS — I129 Hypertensive chronic kidney disease with stage 1 through stage 4 chronic kidney disease, or unspecified chronic kidney disease: Secondary | ICD-10-CM | POA: Diagnosis present

## 2020-06-26 DIAGNOSIS — K219 Gastro-esophageal reflux disease without esophagitis: Secondary | ICD-10-CM | POA: Diagnosis present

## 2020-06-26 DIAGNOSIS — Z9114 Patient's other noncompliance with medication regimen: Secondary | ICD-10-CM

## 2020-06-26 DIAGNOSIS — Z20822 Contact with and (suspected) exposure to covid-19: Secondary | ICD-10-CM | POA: Diagnosis present

## 2020-06-26 DIAGNOSIS — Z8249 Family history of ischemic heart disease and other diseases of the circulatory system: Secondary | ICD-10-CM

## 2020-06-26 DIAGNOSIS — M19042 Primary osteoarthritis, left hand: Secondary | ICD-10-CM | POA: Diagnosis present

## 2020-06-26 DIAGNOSIS — R17 Unspecified jaundice: Secondary | ICD-10-CM

## 2020-06-26 HISTORY — DX: Acute kidney failure, unspecified: N17.9

## 2020-06-26 HISTORY — DX: Anemia, unspecified: D64.9

## 2020-06-26 LAB — CBC
HCT: 34.5 % — ABNORMAL LOW (ref 38.5–50.0)
HCT: 39.3 % (ref 39.0–52.0)
Hemoglobin: 12 g/dL — ABNORMAL LOW (ref 13.2–17.1)
Hemoglobin: 13 g/dL (ref 13.0–17.0)
MCH: 32 pg (ref 27.0–33.0)
MCH: 34.5 pg — ABNORMAL HIGH (ref 26.0–34.0)
MCHC: 34.8 g/dL (ref 32.0–36.0)
MCHC: 33.1 g/dL (ref 30.0–36.0)
MCV: 92 fL (ref 80.0–100.0)
MCV: 104.2 fL — ABNORMAL HIGH (ref 80.0–100.0)
MPV: 10.5 fL (ref 7.5–12.5)
Platelets: 120 10*3/uL — ABNORMAL LOW (ref 140–400)
Platelets: 117 10*3/uL — ABNORMAL LOW (ref 150–400)
RBC: 3.75 10*6/uL — ABNORMAL LOW (ref 4.20–5.80)
RBC: 3.77 MIL/uL — ABNORMAL LOW (ref 4.22–5.81)
RDW: 13.2 % (ref 11.0–15.0)
RDW: 14.6 % (ref 11.5–15.5)
WBC: 13.7 10*3/uL — ABNORMAL HIGH (ref 3.8–10.8)
WBC: 14.4 10*3/uL — ABNORMAL HIGH (ref 4.0–10.5)
nRBC: 0 % (ref 0.0–0.2)

## 2020-06-26 LAB — COMPREHENSIVE METABOLIC PANEL
ALT: 34 U/L (ref 0–44)
AST: 77 U/L — ABNORMAL HIGH (ref 15–41)
Albumin: 2.1 g/dL — ABNORMAL LOW (ref 3.5–5.0)
Alkaline Phosphatase: 226 U/L — ABNORMAL HIGH (ref 38–126)
Anion gap: 9 (ref 5–15)
BUN: 34 mg/dL — ABNORMAL HIGH (ref 8–23)
CO2: 23 mmol/L (ref 22–32)
Calcium: 8.1 mg/dL — ABNORMAL LOW (ref 8.9–10.3)
Chloride: 94 mmol/L — ABNORMAL LOW (ref 98–111)
Creatinine, Ser: 1.87 mg/dL — ABNORMAL HIGH (ref 0.61–1.24)
GFR, Estimated: 38 mL/min — ABNORMAL LOW (ref >60)
Glucose, Bld: 132 mg/dL — ABNORMAL HIGH (ref 70–99)
Potassium: 3.7 mmol/L (ref 3.5–5.1)
Sodium: 126 mmol/L — ABNORMAL LOW (ref 135–145)
Total Bilirubin: 6.8 mg/dL — ABNORMAL HIGH (ref 0.3–1.2)
Total Protein: 5.7 g/dL — ABNORMAL LOW (ref 6.5–8.1)

## 2020-06-26 LAB — PROTIME-INR
INR: 1.5 — ABNORMAL HIGH
INR: 1.6 — ABNORMAL HIGH (ref 0.8–1.2)
Prothrombin Time: 15.1 s — ABNORMAL HIGH (ref 9.0–11.5)
Prothrombin Time: 19.4 seconds — ABNORMAL HIGH (ref 11.4–15.2)

## 2020-06-26 LAB — LACTIC ACID, PLASMA
Lactic Acid, Venous: 2.8 mmol/L (ref 0.5–1.9)
Lactic Acid, Venous: 1.8 mmol/L (ref 0.5–1.9)

## 2020-06-26 LAB — AFP TUMOR MARKER: AFP-Tumor Marker: 3.4 ng/mL (ref <6.1)

## 2020-06-26 LAB — COMPLETE METABOLIC PANEL WITH GFR
AG Ratio: 0.8 (calc) — ABNORMAL LOW (ref 1.0–2.5)
ALT: 31 U/L (ref 9–46)
AST: 68 U/L — ABNORMAL HIGH (ref 10–35)
Albumin: 2.5 g/dL — ABNORMAL LOW (ref 3.6–5.1)
Alkaline phosphatase (APISO): 232 U/L — ABNORMAL HIGH (ref 35–144)
BUN/Creatinine Ratio: 17 (calc) (ref 6–22)
BUN: 36 mg/dL — ABNORMAL HIGH (ref 7–25)
CO2: 26 mmol/L (ref 20–32)
Calcium: 8.6 mg/dL (ref 8.6–10.3)
Chloride: 95 mmol/L — ABNORMAL LOW (ref 98–110)
Creat: 2.18 mg/dL — ABNORMAL HIGH (ref 0.70–1.18)
GFR, Est African American: 34 mL/min/{1.73_m2} — ABNORMAL LOW (ref >=60)
GFR, Est Non African American: 29 mL/min/{1.73_m2} — ABNORMAL LOW (ref >=60)
Globulin: 3.2 g/dL (calc) (ref 1.9–3.7)
Glucose, Bld: 95 mg/dL (ref 65–139)
Potassium: 3.5 mmol/L (ref 3.5–5.3)
Sodium: 130 mmol/L — ABNORMAL LOW (ref 135–146)
Total Bilirubin: 6.8 mg/dL — ABNORMAL HIGH (ref 0.2–1.2)
Total Protein: 5.7 g/dL — ABNORMAL LOW (ref 6.1–8.1)

## 2020-06-26 LAB — MAGNESIUM: Magnesium: 2.2 mg/dL (ref 1.7–2.4)

## 2020-06-26 LAB — PHOSPHORUS: Phosphorus: 3.8 mg/dL (ref 2.5–4.6)

## 2020-06-26 LAB — SARS CORONAVIRUS 2 (TAT 6-24 HRS): SARS Coronavirus 2: NEGATIVE

## 2020-06-26 LAB — PATHOLOGIST SMEAR REVIEW

## 2020-06-26 LAB — AMMONIA: Ammonia: 73 umol/L — ABNORMAL HIGH (ref <=72)

## 2020-06-26 MED ORDER — ACETAMINOPHEN 325 MG PO TABS: 650.0000 mg | ORAL_TABLET | Freq: Four times a day (QID) | ORAL | Status: DC | PRN

## 2020-06-26 MED ORDER — ONDANSETRON HCL 4 MG PO TABS
4.0000 mg | ORAL_TABLET | Freq: Four times a day (QID) | ORAL | Status: DC | PRN
Start: 2020-06-26 — End: 2020-06-30

## 2020-06-26 MED ORDER — LACTULOSE 10 GM/15ML PO SOLN
20.0000 g | Freq: Two times a day (BID) | ORAL | Status: DC
  Administered 2020-06-26 – 2020-06-29 (×6): 20 g via ORAL
  Filled 2020-06-26 (×6): qty 30

## 2020-06-26 MED ORDER — ONDANSETRON HCL 4 MG/2ML IJ SOLN: 4.0000 mg | Freq: Four times a day (QID) | INTRAMUSCULAR | Status: DC | PRN

## 2020-06-26 MED ORDER — IPRATROPIUM-ALBUTEROL 0.5-2.5 (3) MG/3ML IN SOLN: 3.0000 mL | Freq: Four times a day (QID) | RESPIRATORY_TRACT | Status: DC | PRN

## 2020-06-26 MED ORDER — FUROSEMIDE 20 MG PO TABS
20.0000 mg | ORAL_TABLET | Freq: Every day | ORAL | Status: DC
  Administered 2020-06-27 – 2020-06-30 (×4): 20 mg via ORAL
  Filled 2020-06-26 (×4): qty 1

## 2020-06-26 MED ORDER — SPIRONOLACTONE 25 MG PO TABS
50.0000 mg | ORAL_TABLET | Freq: Every day | ORAL | Status: DC
  Administered 2020-06-27 – 2020-06-30 (×4): 50 mg via ORAL
  Filled 2020-06-26 (×4): qty 2

## 2020-06-26 MED ORDER — RIFAXIMIN 550 MG PO TABS
550.0000 mg | ORAL_TABLET | Freq: Two times a day (BID) | ORAL | Status: DC
  Administered 2020-06-26 – 2020-06-30 (×8): 550 mg via ORAL
  Filled 2020-06-26 (×8): qty 1

## 2020-06-26 MED ORDER — SODIUM CHLORIDE 0.9 % IV SOLN
2.0000 g | INTRAVENOUS | Status: DC
  Administered 2020-06-26 – 2020-06-30 (×5): 2 g via INTRAVENOUS
  Filled 2020-06-26 (×5): qty 20

## 2020-06-26 MED ORDER — ALBUMIN HUMAN 25 % IV SOLN
25.0000 g | Freq: Four times a day (QID) | INTRAVENOUS | Status: AC
  Administered 2020-06-26 – 2020-06-27 (×4): 25 g via INTRAVENOUS
  Filled 2020-06-26 (×5): qty 100

## 2020-06-26 MED ORDER — GUAIFENESIN-DM 100-10 MG/5ML PO SYRP: 15.0000 mL | ORAL_SOLUTION | Freq: Three times a day (TID) | ORAL | Status: DC | PRN

## 2020-06-26 MED ORDER — ACETAMINOPHEN 650 MG RE SUPP: 650.0000 mg | Freq: Four times a day (QID) | RECTAL | Status: DC | PRN

## 2020-06-26 MED ORDER — SODIUM CHLORIDE 0.9 % IV SOLN: INTRAVENOUS | Status: AC

## 2020-06-26 MED ORDER — ALUM & MAG HYDROXIDE-SIMETH 200-200-20 MG/5ML PO SUSP
30.0000 mL | ORAL | Status: DC | PRN
  Administered 2020-06-26 – 2020-06-27 (×3): 30 mL via ORAL
  Filled 2020-06-26 (×3): qty 30

## 2020-06-26 MED ORDER — PANTOPRAZOLE SODIUM 40 MG PO TBEC
40.0000 mg | DELAYED_RELEASE_TABLET | Freq: Every day | ORAL | Status: DC
  Administered 2020-06-27: 40 mg via ORAL
  Filled 2020-06-26: qty 1

## 2020-06-26 NOTE — ED Notes (Signed)
Date and time results received: 06/26/20 1133  Test: lac Critical Value: 2.8  Name of Provider Notified: Reather Converse

## 2020-06-26 NOTE — Progress Notes (Addendum)
Patient received from ED,report given by Ezequiel Essex.  Patient received to room 317. Vital signs stable. Alert and oriented. No c/o pain or discomfort at this time. Family at the bedside. Patient guide pamphlet provided. Patient and family oriented on how to use the call bell,and the TV controller,verbalized understanding. Will continue to monitor patient.

## 2020-06-26 NOTE — H&P (Signed)
History and Physical    Marcus Pham:[id number] DOB: 09-25-1950 DOA: 06/26/2020  PCP: [name], MD   Patient coming from: Home  I have personally briefly reviewed patient's old medical records in [place]  Chief Complaint: Abnormal ascitic fluid during recent paracentesis suggesting SBP.  HPI: Marcus Pham is a 70 y.o. male with medical history significant of chronic kidney disease a stage IIIb, gastroesophageal flux disease, hyperlipidemia, hypertension, history of depression/anxiety and alcoholic liver cirrhosis; who presented to the hospital secondary to abnormal findings after recent outpatient paracentesis suggesting the presence of SBP.  Patient with elevated WBCs, worsening renal function and ascitic fluid demonstrating more than 250 PMNs.  Patient expressed some intermittent coughing spells without frank shortness of breath, no chest pain, no dysuria, no hematuria, no melena, no hematochezia, no nausea vomiting, no frank abdominal discomfort currently and no sick contacts.  Oriented x4 during evaluation.  Vaccinated against COVID using [place] (Patient is Boosted)  ED Course: Blood work in ED demonstrating WBCs of 14.4, stable hemoglobin level at 13.0 and platelets count of 117; comprehensive metabolic panel with a stable LFTs, renal function with creatinine of 1.87 and BUN of 34.  Patient with hyponatremia sodium 126 and chloride 94.  Case discussed with GI service who recommended admission for treatment of SBP and to provide IV albumin.  Patient is not meeting sepsis criteria at this time.  Review of Systems: As per HPI otherwise all other systems reviewed and are negative.   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   TONSILLECTOMY     as a child    Social History  reports that he has quit smoking. He has never used smokeless tobacco. He reports previous alcohol use of about 4.0 - 5.0 standard drinks of alcohol per week. He reports previous drug use. Drug: Marijuana.  No Known Allergies  Family History  Problem Relation Age of Onset   Hypertension Mother    Arthritis Mother    Heart disease Father 61       CHF   Alcohol abuse Brother        substance abuse, died of trauma   Heart disease Paternal Grandfather 87       MI    Prior to Admission medications   Medication Sig Start Date End Date Taking? Authorizing Provider  cyclobenzaprine (FLEXERIL) 10 MG tablet Take 10 mg by mouth at bedtime as needed for muscle spasms. 08/04/19  Yes [provider]  furosemide (LASIX) 40 MG tablet Take 1 tablet (40 mg total) by mouth daily. 04/26/20 04/26/21 Yes Carver, Elon Alas, DO  ibuprofen (ADVIL,MOTRIN) 800 MG tablet Take 800 mg by mouth as needed  for moderate pain.   Yes [provider]  lactulose (CHRONULAC) 10 GM/15ML solution Take 30 mLs (20 g total) by mouth 3 (three) times daily. Titrate up to 2-3 loose BMs daily 04/26/20 10/23/20 Yes Carver, Elon Alas, DO  omeprazole (PRILOSEC) 20 MG capsule Take 20 mg by mouth daily as needed (acid reflux). 08/01/15  Yes [provider]  ondansetron (ZOFRAN) 4 MG tablet Take 4 mg by mouth every 8 (eight) hours as needed for nausea or vomiting. 03/07/20  Yes [provider]  rifaximin (XIFAXAN)  550 MG TABS tablet Take 1 tablet (550 mg total) by mouth 2 (two) times daily. 04/26/20 04/26/21 Yes Carver, Elon Alas, DO  spironolactone (ALDACTONE) 100 MG tablet Take 1 tablet (100 mg total) by mouth daily. 04/26/20 04/26/21 Yes Eloise Harman, DO    Physical Exam: Vitals:   06/26/20 1430 06/26/20 1500 06/26/20 1530 06/26/20 1651  BP: 131/68 (!) 118/59 128/68 (!) 147/71  Pulse: 70 73 70 69  Resp: (!) 29 15 13    Temp:    (!) 97.5 F (36.4 C)  TempSrc:    Oral  SpO2: 99% 98% 100% 100%  Weight:      Height:        Constitutional: Afebrile, no acute distress.  Denies nausea vomiting. Vitals:   06/26/20 1430 06/26/20 1500 06/26/20 1530 06/26/20 1651  BP: 131/68 (!) 118/59 128/68 (!) 147/71  Pulse: 70 73 70 69  Resp: (!) 29 15 13    Temp:    (!) 97.5 F (36.4 C)  TempSrc:    Oral  SpO2: 99% 98% 100% 100%  Weight:      Height:       Eyes: PERRL, lids and conjunctivae normal; no nystagmus. ENMT: Mucous membranes are moist. Posterior pharynx clear of any exudate or lesions. Neck: normal, supple, no masses, no thyromegaly Respiratory: clear to auscultation bilaterally, no wheezing, no crackles. Normal respiratory effort.  No using accessory muscles.  Good saturation on room air. Cardiovascular: Regular rate and rhythm, no rubs, no gallops, no murmurs.  No JVD on exam. Abdomen: Soft, mild distention appreciated.  Positive small fluid wave sign appreciated; no tenderness on the patient.  Positive bowel sounds.   Musculoskeletal: no cyanosis or clubbing; trace to 1+ edema appreciated bilaterally on his legs. Skin: no rashes, no petechiae. Neurologic: CN 2-12 grossly intact.  No focal weakness. Psychiatric: Normal judgment and insight. Alert and oriented x 3. Normal mood.   Labs on Admission: I have personally reviewed following labs and imaging studies  CBC: Recent Labs  Lab 06/25/20 1112 06/26/20 1102  WBC 13.7* 14.4*  HGB 12.0* 13.0  HCT 34.5* 39.3  MCV 92.0 104.2*  PLT 120*  117*    Basic Metabolic Panel: Recent Labs  Lab 06/25/20 1112 06/26/20 1102  NA 130* 126*  K 3.5 3.7  CL 95* 94*  CO2 26 23  GLUCOSE 95 132*  BUN 36* 34*  CREATININE 2.18* 1.87*  CALCIUM 8.6 8.1*    GFR: Estimated Creatinine Clearance: 40.9 mL/min (A) (by C-G formula based on SCr of 1.87 mg/dL (H)).  Liver Function Tests: Recent Labs  Lab 06/25/20 1112 06/26/20 1102  AST 68* 77*  ALT 31 34  ALKPHOS  --  226*  BILITOT 6.8* 6.8*  PROT 5.7* 5.7*  ALBUMIN  --  2.1*   Radiological Exams on Admission: US Paracentesis  Result Date: 06/25/2020 INDICATION: Cirrhosis Recurrent ascites EXAM: ULTRASOUND GUIDED RLQ PARACENTESIS MEDICATIONS: 10 cc 1% lidocaine COMPLICATIONS: None immediate.  PROCEDURE: Informed written consent was obtained from the patient after a discussion of the risks, benefits and alternatives to treatment. A timeout was performed prior to the initiation of the procedure. Initial ultrasound scanning demonstrates a large amount of ascites within the right lower abdominal quadrant. The right lower abdomen was prepped and draped in the usual sterile fashion. 1% lidocaine was used for local anesthesia. Following this, a Yueh catheter was introduced. An ultrasound image was saved for documentation purposes. The paracentesis was performed. The catheter was removed and a dressing was applied. The patient tolerated the procedure well without immediate post procedural complication. Patient received post-procedure intravenous albumin; see nursing notes for details. FINDINGS: A total of approximately 3.3 liters of amber fluid was removed. Samples were sent to the laboratory as requested by the clinical team. IMPRESSION: Successful ultrasound-guided paracentesis yielding 3.3 liters of peritoneal fluid. Read by Lavonia Drafts [facility] Electronically Signed   By: Lavonia Dana M.D.   On: 06/25/2020 10:58    EKG: none   Assessment/Plan 1-SBP (spontaneous bacterial peritonitis)  ([place]) -Sepsis has been doing well -Elevated WBCs, but no nausea, no vomiting, no abdominal pain, no fever. -Ascitic fluid results from recent outpatient paracentesis suggesting the presence of SBP. -Following gastroenterology service recommendations patient will be treated with IV antibiotics and IV albumin. -Gentle hydration will be given overnight and his diuretics will be resumed at half the dose only on 06/27/20. -Patient has been encourage to follow low-sodium diet.  2-Essential hypertension -Stable overall  -Continue to follow vital signs.  3-GERD (gastroesophageal reflux disease) -Continue PPI.  4-Hepatic cirrhosis ([place]) -Will follow further recommendation by gastroenterology service -For now continue the use of rifaximin, lactulose, PPI and treat with albumin and antibiotics as mentioned in problem 1. -LFTs are stable. -Patient reports last drink was in November 2021.  5-AKI (acute kidney injury) ([place]) on chronic kidney disease a stage III. -Diuretics has been cut in half -Gentle hydration overnight -Continue IV albumin as recommended by GI service  6-recent history of bronchitis -Currently maintaining adequate oxygen supplementation on room air -Lungs auscultations clear -Patient reported intermittent coughing spells (nonproductive) -As needed bronchodilators and also antitussive/mucolytic medications. -Chest x-ray for completion will be assess.  7-thrombocytopenia -In the setting of liver cirrhosis -No overt bleeding appreciated -Holding heparin products and closely following platelets count. -SCDs for DVT prophylaxis.   DVT prophylaxis: SCDs Code Status:   Full code Family Communication:  No family at bedside. Disposition Plan:   Patient is from:  home  Anticipated DC to:  home  Anticipated DC date:  06/27/20  Anticipated DC barriers: Completion evaluation and treatment for SBP.  Consults called:  Gastroenterology service Admission status:  Observation,  length of stay less than 2 midnights; MedSurg bed.  Severity of Illness: The appropriate patient status for this patient is OBSERVATION. Observation status is judged to be reasonable and necessary in order to provide the required intensity of service to ensure the patient's safety. The patient's presenting symptoms, physical exam findings, and initial radiographic and laboratory data in the context of their medical condition is felt to place them at decreased risk for further clinical deterioration. Furthermore, it is anticipated that the patient will be medically stable for discharge from the hospital within 2 midnights of admission. The following factors support the patient status of observation.   " The patient's presenting symptoms include abdominal discomfort and abnormal ascitic fluid results along with elevated WBCs.. " The physical exam findings include mild ascites.  Barton Dubois MD [facility]  How to contact the [facility] Attending or Consulting provider [place] or covering provider during after hours [place], for this patient?   Check the care team in [facility] and look for a) attending/consulting [facility] provider listed and b) the [facility] team listed Log into [email or web address] and use [place] universal password to access. If you do not have the password, please contact the hospital operator. Locate the [facility] provider you are looking for under [facility] and page to a number that you can be directly reached. If you still have difficulty reaching the provider, please page the [facility] (Director on Call) for the Hospitalists listed on amion for assistance.  06/26/2020, 5:39 PM

## 2020-06-26 NOTE — Telephone Encounter (Signed)
Patient underwent paracentesis yesterday with just over 3 L removed.  Cell count showed PMN of 573 concerning for SBP.  Also his renal function is worsening.  Recommend that he go to the emergency room for evaluation and likely admission for IV antibiotics as well as IV albumin.  I attempted to call patient with no response.  I also attempted to call his daughter [name] who is usually the reliable 1 in regards to her father's care and left her a voicemail.  Can we continue to call them this a.m. in hopes of relaying this message?  Thank you

## 2020-06-26 NOTE — ED Provider Notes (Signed)
[facility] EMERGENCY DEPARTMENT Provider Note   CSN: [id number] Arrival date & time: 06/26/20  1026     History Chief Complaint  Patient presents with   Abnormal Lab    Marcus Pham is a 70 y.o. male.  Patient presents the emergency department at the request of his physician that he saw yesterday.  He had a paracentesis completed which showed concerns for SBP.White count was elevated at 13.7, fluid analysis showed elevated total nucleated cells at 843, elevated neutrophil count at 68, cultures are currently pending.  Gastroenterology reached out and requested we give albumin as well as antibiotics.  Patient denies any symptoms of SBP such as abdominal tenderness, nausea, vomiting, fevers, chills.      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[place]) 12/15/2019   GERD (gastroesophageal reflux disease)    Hyperlipidemia    Hypertension    Substance abuse ([place])    patient denies but says he drinks up to 12 beers a day   Thyroid disease     Patient Active Problem List   Diagnosis Date Noted   Cough 04/23/2020   Bronchitis 04/06/2020   Alcohol abuse 04/02/2020   Major depressive disorder, recurrent episode, moderate ([place]) 04/02/2020   Obesity 04/02/2020   Posttraumatic stress disorder 04/02/2020   Ascites 12/15/2019   Hepatic cirrhosis ([place]) 12/15/2019   Portal hypertension ([place]) 12/15/2019   Low back pain 10/19/2019   Vitamin D deficiency 09/25/2015   Essential hypertension 09/10/2015   Hyperlipidemia 09/10/2015   GERD (gastroesophageal reflux disease) 09/10/2015    Past Surgical History:  Procedure Laterality Date   cyst     1970s-removal in [employer]    ESOPHAGOGASTRODUODENOSCOPY (EGD) WITH PROPOFOL N/A 01/10/2020   Procedure: ESOPHAGOGASTRODUODENOSCOPY (EGD) WITH PROPOFOL;  Surgeon: Eloise Harman, DO;  Location: [facility];  Service: Endoscopy;  Laterality: N/A;   2:15pm   ROTATOR CUFF REPAIR Left    2002-2003?   TONSILLECTOMY     as a child       Family History  Problem Relation Age of Onset   Hypertension Mother    Arthritis Mother    Heart disease Father 64       CHF   Alcohol abuse Brother        substance abuse, died of trauma   Heart disease Paternal Grandfather 27       MI    Social History   Tobacco Use   Smoking status: Former    Pack years: 0.00   Smokeless tobacco: Never   Tobacco comments:    quit 40 years ago   Vaping Use   Vaping Use: Never used  Substance Use Topics   Alcohol use: Not Currently    Alcohol/week: 4.0 - 5.0 standard drinks    Types: 4 - 5 Cans of beer per week   Drug use: Not Currently    Types: Marijuana    Comment: occasional marijuana use    Home Medications Prior to Admission medications   Medication Sig Start Date End Date Taking? Authorizing Provider  amoxicillin-clavulanate (AUGMENTIN) 875-125 MG tablet Take 1 tablet by mouth 2 (two) times daily. 04/26/20   Noreene Larsson, NP  cyclobenzaprine (FLEXERIL) 10 MG tablet as needed. 08/04/19   [provider]  furosemide (LASIX) 40 MG tablet Take 1 tablet (40 mg total) by mouth daily. 04/26/20 04/26/21  Eloise Harman, DO  ibuprofen (ADVIL,MOTRIN) 800 MG tablet Take 800 mg by mouth as needed for moderate pain.    [provider]  lactulose (CHRONULAC) 10 GM/15ML solution Take 30 mLs (20 g total) by mouth 3 (three) times daily. Titrate up to 2-3 loose BMs daily 04/26/20 10/23/20  Eloise Harman, DO  omeprazole (PRILOSEC) 20 MG capsule Take 20 mg by mouth daily as needed (acid reflux). 08/01/15   [provider]  ondansetron (ZOFRAN) 4 MG tablet Take by mouth as needed. 03/07/20   [provider]  rifaximin (XIFAXAN) 550 MG TABS tablet Take 1 tablet (550 mg total) by mouth 2 (two) times daily. 04/26/20 04/26/21  Eloise Harman, DO  spironolactone (ALDACTONE) 100 MG tablet Take 1 tablet (100 mg total) by mouth  daily. 04/26/20 04/26/21  Eloise Harman, DO    Allergies    Patient has no known allergies.  Review of Systems   Review of Systems  Constitutional:  Negative for chills and fever.  HENT:  Negative for congestion and rhinorrhea.   Eyes:  Negative for visual disturbance.  Respiratory:  Negative for shortness of breath.   Cardiovascular:  Negative for chest pain.  Gastrointestinal:  Positive for abdominal distention. Negative for abdominal pain, nausea and vomiting.  Endocrine: Negative for polyuria.  Genitourinary:  Negative for decreased urine volume and dysuria.  Musculoskeletal:  Negative for back pain.  Neurological:  Negative for headaches.   Physical Exam Updated Vital Signs BP (!) 96/58   Pulse 69   Temp 97.8 F (36.6 C) (Oral)   Resp 16   Ht 6\' 1"  (1.854 m)   Wt 92.5 kg   SpO2 98%   BMI 26.91 kg/m   Physical Exam Constitutional:      General: He is not in acute distress.    Appearance: Normal appearance. He is ill-appearing.  HENT:     Head: Normocephalic and atraumatic.     Nose: Nose normal. No congestion.     Mouth/Throat:     Mouth: Mucous membranes are moist.  Eyes:     General: Scleral icterus present.        Right eye: No discharge.        Left eye: No discharge.  Cardiovascular:     Rate and Rhythm: Normal rate and regular rhythm.     Pulses: Normal pulses.     Heart sounds: Normal heart sounds. No murmur heard. Musculoskeletal:     Cervical back: Normal range of motion and neck supple.  Neurological:     Mental Status: He is alert.    ED Results / Procedures / Treatments   Labs (all labs ordered are listed, but only abnormal results are displayed) Labs Reviewed  CBC - Abnormal; Notable for the following components:      Result Value   WBC 14.4 (*)    RBC 3.77 (*)    MCV 104.2 (*)    MCH 34.5 (*)    Platelets 117 (*)    All other components within normal limits  COMPREHENSIVE METABOLIC PANEL - Abnormal; Notable for the following  components:   Sodium 126 (*)    Chloride 94 (*)    Glucose, Bld 132 (*)    BUN 34 (*)    Creatinine, Ser 1.87 (*)    Calcium 8.1 (*)    Total Protein 5.7 (*)    Albumin 2.1 (*)    AST 77 (*)    Alkaline Phosphatase 226 (*)  Total Bilirubin 6.8 (*)    GFR, Estimated 38 (*)    All other components within normal limits  LACTIC ACID, PLASMA - Abnormal; Notable for the following components:   Lactic Acid, Venous 2.8 (*)    All other components within normal limits  CULTURE, BLOOD (ROUTINE X 2)  CULTURE, BLOOD (ROUTINE X 2)  LACTIC ACID, PLASMA    EKG None  Radiology US Paracentesis  Result Date: 06/25/2020 INDICATION: Cirrhosis Recurrent ascites EXAM: ULTRASOUND GUIDED RLQ PARACENTESIS MEDICATIONS: 10 cc 1% lidocaine COMPLICATIONS: None immediate. PROCEDURE: Informed written consent was obtained from the patient after a discussion of the risks, benefits and alternatives to treatment. A timeout was performed prior to the initiation of the procedure. Initial ultrasound scanning demonstrates a large amount of ascites within the right lower abdominal quadrant. The right lower abdomen was prepped and draped in the usual sterile fashion. 1% lidocaine was used for local anesthesia. Following this, a Yueh catheter was introduced. An ultrasound image was saved for documentation purposes. The paracentesis was performed. The catheter was removed and a dressing was applied. The patient tolerated the procedure well without immediate post procedural complication. Patient received post-procedure intravenous albumin; see nursing notes for details. FINDINGS: A total of approximately 3.3 liters of amber fluid was removed. Samples were sent to the laboratory as requested by the clinical team. IMPRESSION: Successful ultrasound-guided paracentesis yielding 3.3 liters of peritoneal fluid. Read by Lavonia Drafts [facility] Electronically Signed   By: Lavonia Dana M.D.   On: 06/25/2020 10:58    Procedures Procedures    Medications Ordered in ED Medications  cefTRIAXone (ROCEPHIN) 2 g in sodium chloride 0.9 % 100 mL IVPB (2 g Intravenous New Bag/Given 06/26/20 1239)  albumin human 25 % solution 25 g (has no administration in time range)    ED Course  I have reviewed the triage vital signs and the nursing notes.  Pertinent labs & imaging results that were available during my care of the patient were reviewed by me and considered in my medical decision making (see chart for details).    MDM Rules/Calculators/A&P                         70 year old male presenting to the emergency department after instructed to by gastroenterology team for concern for SBP.  He had paracentesis completed 6/13 and they remove 3 L of fluid.  Cell count showed PMNs of 573.  Renal function was also worsened so they recommended he come for evaluation the emergency room and probable admission.  On arrival patient's vital signs are within normal limits. He denies any abdominal pain, fever, signs of infection.  Reports that he has had close to a dozen paracentesis before.  WBC yesterday was 13.7, repeat today was 14.4.  He also had an elevated lactic acid of 2.8.  Patient's creatinine was elevated yesterday at 2.18, repeat trending down today at 1.87.  LFTs mildly elevated at 77 AST.  T bili elevated at 6.8.  Blood cultures were ordered.  Aliene Altes PA with GI consulted and requested albumin and IV antibiotics.  Hospitalist team paged for admission for further management and continuation of IV antibiotics.  GI is also To evaluate the patient while hospitalized.  Final Clinical Impression(s) / ED Diagnoses Final diagnoses:  SBP (spontaneous bacterial peritonitis) ([place])    Rx / [place] Orders ED Discharge Orders     None        Gifford Shave, MD  06/26/20 1308    Elnora Morrison, MD 07/02/20 [id number]

## 2020-06-26 NOTE — ED Notes (Signed)
Report called to Val on the third floor.

## 2020-06-26 NOTE — Consult Note (Signed)
@LOGO @   Referring Provider: Dr. Reather Converse Primary Care Physician:  [name], MD Primary Gastroenterologist:  Dr. Abbey Chatters  Date of Admission: 06/26/20 Date of Consultation: 06/26/20  Reason for Consultation:  SBP  HPI:  Marcus Pham is a 70 y.o. year old male with history of suspected alcoholic cirrhosis, abstinent since November 5397, complicated by hepatic encephalopathy on lactulose and Xifaxan and ascites on Lasix and Aldactone and requiring paras who presented to the emergency room at the request of Dr. Abbey Chatters after undergoing paracentesis yesterday with PMNs >250 consistent with SBP.  Reviewed all labs completed yesterday.  Gram stain without organisms.  Culture with no growth in less than 24 hours. PMNs 573.  Hemoglobin 12 with unknown baseline, WBC elevated at 13.7, creatinine bumped to 2.18 from 1.03 two months ago, sodium low at 130, bilirubin up to 6.8 from 3.2, alk phos 232, AST 68, ALT 31.  INR 1.5.  Ammonia elevated at 73.  AFP within normal limits.  MELD 29, up from 17 in March.   EGD on file from December 2021 with no evidence of esophageal varices.  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  In the ED:  Patient has remained hemodynamically stable.  Afebrile. Labs remarkable for WBC 14.4, platelets 117, sodium 126, creatinine 1.87, albumin 2.1, AST 77, alk phos 226, total bilirubin 6.8 , lactic acid elevated at 2.8, blood cultures ordered.  Today:  Increased weakness, headache, not sleeping well for the last couple of weeks.  Also with increased cough for the last 6 weeks. Was diagnosed with bronchitis by PCP. Treated with antibiotics with improvement. Noticed return over the last couple of weeks. Some SOB. No fever or chills. Daughter also reports some delayed speech/sluring. This has improved over the last 24 hours. Taking Lactulose about "1/2 the time".  Unable to get a clear answer on how frequent.  Had a bout of constipation 4-5 days ago. Lactulose causes  nausea and diarrhea.  Will mix with tea which helps.  He had bowel movements 4 days of the last 10 days.  Taking Xifaxan BID.   No abdominal pain. Urinating normally. No dysuria. Urine is dark. Increased swelling the legs over the last 2 weeks. No N/V, brbpr, or melena.   Reports he is following a low-sodium diet, but also admits to eating hotdogs, deli meat, some frozen foods, occasional canned items, and wife buys fast food every evening though he reports he does not eat this.  No tylenol. No alcohol since November 2021.   Taking omeprazole 20 mg PRN. Reflux is not frequent. Last took omeprazole 3 weeks ago.   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   TONSILLECTOMY     as a child    Prior  to Admission medications   Medication Sig Start Date End Date Taking? Authorizing Provider  amoxicillin-clavulanate (AUGMENTIN) 875-125 MG tablet Take 1 tablet by mouth 2 (two) times daily. 04/26/20   Noreene Larsson, NP  cyclobenzaprine (FLEXERIL) 10 MG tablet as needed. 08/04/19   [provider]  furosemide (LASIX) 40 MG tablet Take 1 tablet (40 mg total) by mouth daily. 04/26/20 04/26/21  Eloise Harman, DO  ibuprofen (ADVIL,MOTRIN) 800 MG tablet Take 800 mg by mouth as needed for moderate pain.    [provider]  lactulose (CHRONULAC) 10  GM/15ML solution Take 30 mLs (20 g total) by mouth 3 (three) times daily. Titrate up to 2-3 loose BMs daily 04/26/20 10/23/20  Eloise Harman, DO  omeprazole (PRILOSEC) 20 MG capsule Take 20 mg by mouth daily as needed (acid reflux). 08/01/15   [provider]  ondansetron (ZOFRAN) 4 MG tablet Take by mouth as needed. 03/07/20   [provider]  rifaximin (XIFAXAN) 550 MG TABS tablet Take 1 tablet (550 mg total) by mouth 2 (two) times daily. 04/26/20 04/26/21  Eloise Harman, DO  spironolactone (ALDACTONE) 100 MG tablet Take 1 tablet (100 mg total) by mouth daily. 04/26/20 04/26/21  Eloise Harman, DO    No current facility-administered medications for this encounter.   Current Outpatient Medications  Medication Sig Dispense Refill   amoxicillin-clavulanate (AUGMENTIN) 875-125 MG tablet Take 1 tablet by mouth 2 (two) times daily. 14 tablet 0   cyclobenzaprine (FLEXERIL) 10 MG tablet as needed.     furosemide (LASIX) 40 MG tablet Take 1 tablet (40 mg total) by mouth daily. 90 tablet 3   ibuprofen (ADVIL,MOTRIN) 800 MG tablet Take 800 mg by mouth as needed for moderate pain.     lactulose (CHRONULAC) 10 GM/15ML solution Take 30 mLs (20 g total) by mouth 3 (three) times daily. Titrate up to 2-3 loose BMs daily 2700 mL 5   omeprazole (PRILOSEC) 20 MG capsule Take 20 mg by mouth daily as needed (acid reflux).     ondansetron (ZOFRAN) 4 MG tablet Take by mouth as needed.     rifaximin (XIFAXAN) 550 MG TABS tablet Take 1 tablet (550 mg total) by mouth 2 (two) times daily. 180 tablet 3   spironolactone (ALDACTONE) 100 MG tablet Take 1 tablet (100 mg total) by mouth daily. 90 tablet 3    Allergies as of 06/26/2020   (No Known Allergies)    Family History  Problem Relation Age of Onset   Hypertension Mother    Arthritis Mother    Heart disease Father 52       CHF   Alcohol abuse Brother        substance abuse, died of trauma   Heart disease Paternal Grandfather 64        MI    Social History   Socioeconomic History   Marital status: Married    Spouse name: Not on file   Number of children: Not on file   Years of education: Not on file   Highest education level: Not on file  Occupational History   Not on file  Tobacco Use   Smoking status: Former    Pack years: 0.00   Smokeless tobacco: Never   Tobacco comments:    quit 40 years ago   Vaping Use   Vaping Use: Never used  Substance and Sexual Activity   Alcohol use: Not Currently    Alcohol/week: 4.0 - 5.0 standard drinks  Types: 4 - 5 Cans of beer per week   Drug use: Not Currently    Types: Marijuana    Comment: occasional marijuana use   Sexual activity: Yes  Other Topics Concern   Not on file  Social History Narrative   Not on file   Social Determinants of Health   Financial Resource Strain: Not on file  Food Insecurity: Not on file  Transportation Needs: Not on file  Physical Activity: Not on file  Stress: Not on file  Social Connections: Not on file  Intimate Partner Violence: Not on file    Review of Systems: Gen: Denies fever, chills, presyncope, syncope. CV: Denies chest pain or palpitations. Resp: See HPI GI: See HPI GU : See HPI Heme: See HPI  Physical Exam: Vital signs in last 24 hours: Temp:  [97.8 F (36.6 C)] 97.8 F (36.6 C) (06/14 1041) Pulse Rate:  [70] 70 (06/14 1041) Resp:  [18] 18 (06/14 1041) BP: (140)/(76) 140/76 (06/14 1041) SpO2:  [98 %] 98 % (06/14 1041) Weight:  [92.5 kg] 92.5 kg (06/14 1041)   General:   Alert,  well-developed, well-nourished, pleasant and cooperative in NAD, jaundiced, ill appearing.  Head:  Normocephalic and atraumatic. Eyes:  +Scleral icterus.   Ears:  Normal auditory acuity. Lungs:  Clear throughout to auscultation.   No wheezes, crackles, or rhonchi. No acute distress. Heart:  Regular rate and rhythm; no murmurs, clicks, rubs,  or gallops. Abdomen:  Soft, nontender and nondistended. Soft umbilical hernia that has a  bluish discoloration but is non-tender. No hepatosplenomegaly. Decreased bowel sounds. No guarding, and without rebound.   Rectal:  Deferred Msk:  Symmetrical without gross deformities. Normal posture. Extremities:  With 2+ bilateral LE edema up to knees.  Neurologic:  Alert and  oriented x4. Slight tremor vs asterixis. Patient reports this is chronic.  Skin:  Intact without significant lesions or rashes. Psych: Normal mood and affect.  Intake/Output from previous day: No intake/output data recorded. Intake/Output this shift: No intake/output data recorded.  Lab Results: Recent Labs    06/25/20 1112  WBC 13.7*  HGB 12.0*  HCT 34.5*  PLT 120*   BMET Recent Labs    06/25/20 1112 06/26/20 1102  NA 130* 126*  K 3.5 3.7  CL 95* 94*  CO2 26 23  GLUCOSE 95 132*  BUN 36* 34*  CREATININE 2.18* 1.87*  CALCIUM 8.6 8.1*   LFT Recent Labs    06/25/20 1112 06/26/20 1102  PROT 5.7* 5.7*  ALBUMIN  --  2.1*  AST 68* 77*  ALT 31 34  ALKPHOS  --  226*  BILITOT 6.8* 6.8*   PT/INR Recent Labs    06/25/20 1112  LABPROT 15.1*  INR 1.5*   Studies/Results: US Paracentesis  Result Date: 06/25/2020 INDICATION: Cirrhosis Recurrent ascites EXAM: ULTRASOUND GUIDED RLQ PARACENTESIS MEDICATIONS: 10 cc 1% lidocaine COMPLICATIONS: None immediate. PROCEDURE: Informed written consent was obtained from the patient after a discussion of the risks, benefits and alternatives to treatment. A timeout was performed prior to the initiation of the procedure. Initial ultrasound scanning demonstrates a large amount of ascites within the right lower abdominal quadrant. The right lower abdomen was prepped and draped in the usual sterile fashion. 1% lidocaine was used for local anesthesia. Following this, a Yueh catheter was introduced. An ultrasound image was saved for documentation purposes. The paracentesis was performed. The catheter was removed and a dressing was applied. The patient tolerated the  procedure well without immediate post procedural  complication. Patient received post-procedure intravenous albumin; see nursing notes for details. FINDINGS: A total of approximately 3.3 liters of amber fluid was removed. Samples were sent to the laboratory as requested by the clinical team. IMPRESSION: Successful ultrasound-guided paracentesis yielding 3.3 liters of peritoneal fluid. Read by Lavonia Drafts [facility] Electronically Signed   By: Lavonia Dana M.D.   On: 06/25/2020 10:58    Impression: 70 year old male with history of suspected alcoholic cirrhosis, abstinent since November 6734, complicated by hepatic encephalopathy, on Xifaxan and noncompliant with lactulose, and ascites on Lasix, Aldactone, and requiring paras, now admitted with SBP with PMNs 573 on paracentesis yesterday. Also with acute kidney injury, hyponatremia, increasing bilirubin, and elevate INR at 1.5 yesterday. MELD yesterday was 29, up from 17, 2 months ago.    SBP: Paracentesis completed yesterday yielding 3.3 L.  PMN's 573.  Culture with no growth in less than 24 hours.  No organisms on gram stain.  Denies abdominal pain, fever, chills.  WBC elevated at 14.4 today.  Also with AKI, creatinine up to 2.18 yesterday, 1.7 today, previously 1.03 two months ago.  We will start him on IV ceftriaxone 2 g daily.  He will need to complete a 5-day course of antibiotics.  IV albumin with 1.5 g/kg (max 100 g) on day 1 and 1 g/kg (max 100 g) on day 3.  Notably, omeprazole is listed on patient's outpatient med list.  He takes this very sparingly.  Would recommend discontinuing and using Tums or H2 blocker as needed for reflux symptoms due risk of SBP with PPI.  Cirrhosis: MELD up to 29 yesterday from 17, 2 months ago. This is in the setting of SBP with AKI and decreased oral intake over the last week or 2 secondary to feeling poorly with malaise, weakness, and increased cough recently completing antibiotics for bronchitis.  Daughter also noted some  slurred speech/delayed responses over the last couple of days, but feels this is improved over the last 24 hours.  No overt encephalopathy today, mild tremor/?asterixis on exam though unclear if this is a chronic finding, ammonia mildly elevated at 73.  Currently noncompliant with outpatient lactulose.  He has continued with the Xifaxan twice daily.  Also with increasing peripheral edema over the last couple of weeks likely exacerbated by noncompliance with low-sodium diet. Will need to hold diuretics today due to [place] and AKI, consider resuming tomorrow.   Umbilical hernia: Chronic.  Slight purple/blue discoloration on exam, but hernia is soft and nontender to palpation.  This is more of a chronic finding.  Advised patient to monitor closely and notify us of any change including enlarging hernia, pain, or increased blue/purple discoloration.  Cough: Patient reports cough x6 weeks.  Recently treated for bronchitis palpation by PCP with some improvement, but notes return over the last 2 weeks.  Mild shortness of breath.  No fever.  Lung sounds clear to auscultation.  Notably, WBC elevated at 14.4 in setting of SBP, but cannot rule out other contributing etiology.  COVID testing needs to be collected.  Consider chest x-ray, defer to hospitalist.  Plan: 1.  IV ceftriaxone 2 g daily.  Needs 5-day course. 2.  IV 25% albumin.  Give 1.5 g/kg (max 100 g) on day 1 and 1 g/kg (max 100 g) on day 3. 3.  Recheck INR today.  4.  Continue to monitor renal function, electrolytes, LFTs, and INR daily. 5.  Hold diuretics today.  Consider restarting tomorrow. 6.  Consider repeat para on day  3 if worsening leukocytosis or clinically not improving. 7.  He will need SBP prophylaxis. 8.  Discontinue omeprazole.  Use H2 receptor antagonist or Tums as needed for GERD symptoms. 9.  Resume lactulose 30 mL twice daily.  Goal of 3 BMs daily. 10.  Continue Xifaxan 550 mg twice daily. 11.  2g sodium diet.  12.  Notified  hospitalist of cough/mild shortness of breath.    LOS: 0 days    06/26/2020, 11:30 AM   Aliene Altes, [facility]astroenter[facility]

## 2020-06-26 NOTE — ED Triage Notes (Signed)
Pt to the ED for evaluation for infection by his PCP.  Pt has a paracentesis yesterday.

## 2020-06-26 NOTE — Telephone Encounter (Signed)
Per Dr. Abbey Chatters @ 8:44 am sent me a text to disregard his message that he had spoke with the pt's daughter and pt was going to ER to be evaluated.

## 2020-06-27 DIAGNOSIS — I10 Essential (primary) hypertension: Secondary | ICD-10-CM | POA: Diagnosis not present

## 2020-06-27 DIAGNOSIS — K7200 Acute and subacute hepatic failure without coma: Secondary | ICD-10-CM | POA: Diagnosis not present

## 2020-06-27 DIAGNOSIS — K652 Spontaneous bacterial peritonitis: Secondary | ICD-10-CM | POA: Diagnosis present

## 2020-06-27 DIAGNOSIS — K7290 Hepatic failure, unspecified without coma: Secondary | ICD-10-CM | POA: Diagnosis present

## 2020-06-27 DIAGNOSIS — K7031 Alcoholic cirrhosis of liver with ascites: Secondary | ICD-10-CM | POA: Diagnosis present

## 2020-06-27 DIAGNOSIS — I129 Hypertensive chronic kidney disease with stage 1 through stage 4 chronic kidney disease, or unspecified chronic kidney disease: Secondary | ICD-10-CM | POA: Diagnosis present

## 2020-06-27 DIAGNOSIS — M19041 Primary osteoarthritis, right hand: Secondary | ICD-10-CM | POA: Diagnosis present

## 2020-06-27 DIAGNOSIS — N1832 Chronic kidney disease, stage 3b: Secondary | ICD-10-CM | POA: Diagnosis present

## 2020-06-27 DIAGNOSIS — Z79899 Other long term (current) drug therapy: Secondary | ICD-10-CM | POA: Diagnosis not present

## 2020-06-27 DIAGNOSIS — E871 Hypo-osmolality and hyponatremia: Secondary | ICD-10-CM | POA: Diagnosis present

## 2020-06-27 DIAGNOSIS — R011 Cardiac murmur, unspecified: Secondary | ICD-10-CM | POA: Diagnosis present

## 2020-06-27 DIAGNOSIS — K3189 Other diseases of stomach and duodenum: Secondary | ICD-10-CM | POA: Diagnosis present

## 2020-06-27 DIAGNOSIS — Z8249 Family history of ischemic heart disease and other diseases of the circulatory system: Secondary | ICD-10-CM | POA: Diagnosis not present

## 2020-06-27 DIAGNOSIS — K429 Umbilical hernia without obstruction or gangrene: Secondary | ICD-10-CM | POA: Diagnosis present

## 2020-06-27 DIAGNOSIS — M19042 Primary osteoarthritis, left hand: Secondary | ICD-10-CM | POA: Diagnosis present

## 2020-06-27 DIAGNOSIS — D649 Anemia, unspecified: Secondary | ICD-10-CM | POA: Diagnosis not present

## 2020-06-27 DIAGNOSIS — D6959 Other secondary thrombocytopenia: Secondary | ICD-10-CM | POA: Diagnosis present

## 2020-06-27 DIAGNOSIS — E785 Hyperlipidemia, unspecified: Secondary | ICD-10-CM | POA: Diagnosis present

## 2020-06-27 DIAGNOSIS — K8021 Calculus of gallbladder without cholecystitis with obstruction: Secondary | ICD-10-CM | POA: Diagnosis present

## 2020-06-27 DIAGNOSIS — N179 Acute kidney failure, unspecified: Secondary | ICD-10-CM | POA: Diagnosis present

## 2020-06-27 DIAGNOSIS — K766 Portal hypertension: Secondary | ICD-10-CM | POA: Diagnosis present

## 2020-06-27 DIAGNOSIS — K219 Gastro-esophageal reflux disease without esophagitis: Secondary | ICD-10-CM | POA: Diagnosis present

## 2020-06-27 DIAGNOSIS — R600 Localized edema: Secondary | ICD-10-CM | POA: Diagnosis present

## 2020-06-27 DIAGNOSIS — D631 Anemia in chronic kidney disease: Secondary | ICD-10-CM | POA: Diagnosis present

## 2020-06-27 DIAGNOSIS — R17 Unspecified jaundice: Secondary | ICD-10-CM | POA: Diagnosis not present

## 2020-06-27 DIAGNOSIS — Z9114 Patient's other noncompliance with medication regimen: Secondary | ICD-10-CM | POA: Diagnosis not present

## 2020-06-27 DIAGNOSIS — Z20822 Contact with and (suspected) exposure to covid-19: Secondary | ICD-10-CM | POA: Diagnosis present

## 2020-06-27 DIAGNOSIS — M479 Spondylosis, unspecified: Secondary | ICD-10-CM | POA: Diagnosis present

## 2020-06-27 LAB — COMPREHENSIVE METABOLIC PANEL
ALT: 24 U/L (ref 0–44)
AST: 52 U/L — ABNORMAL HIGH (ref 15–41)
Albumin: 2.6 g/dL — ABNORMAL LOW (ref 3.5–5.0)
Alkaline Phosphatase: 159 U/L — ABNORMAL HIGH (ref 38–126)
Anion gap: 8 (ref 5–15)
BUN: 30 mg/dL — ABNORMAL HIGH (ref 8–23)
CO2: 26 mmol/L (ref 22–32)
Calcium: 8.3 mg/dL — ABNORMAL LOW (ref 8.9–10.3)
Chloride: 97 mmol/L — ABNORMAL LOW (ref 98–111)
Creatinine, Ser: 1.47 mg/dL — ABNORMAL HIGH (ref 0.61–1.24)
GFR, Estimated: 51 mL/min — ABNORMAL LOW (ref >60)
Glucose, Bld: 87 mg/dL (ref 70–99)
Potassium: 3.9 mmol/L (ref 3.5–5.1)
Sodium: 131 mmol/L — ABNORMAL LOW (ref 135–145)
Total Bilirubin: 6 mg/dL — ABNORMAL HIGH (ref 0.3–1.2)
Total Protein: 5.2 g/dL — ABNORMAL LOW (ref 6.5–8.1)

## 2020-06-27 LAB — CBC
HCT: 35.5 % — ABNORMAL LOW (ref 39.0–52.0)
Hemoglobin: 11.2 g/dL — ABNORMAL LOW (ref 13.0–17.0)
MCH: 34.6 pg — ABNORMAL HIGH (ref 26.0–34.0)
MCHC: 31.5 g/dL (ref 30.0–36.0)
MCV: 109.6 fL — ABNORMAL HIGH (ref 80.0–100.0)
Platelets: 94 10*3/uL — ABNORMAL LOW (ref 150–400)
RBC: 3.24 MIL/uL — ABNORMAL LOW (ref 4.22–5.81)
RDW: 14.6 % (ref 11.5–15.5)
WBC: 9.2 10*3/uL (ref 4.0–10.5)
nRBC: 0 % (ref 0.0–0.2)

## 2020-06-27 LAB — PROTIME-INR
INR: 2 — ABNORMAL HIGH (ref 0.8–1.2)
Prothrombin Time: 22.8 seconds — ABNORMAL HIGH (ref 11.4–15.2)

## 2020-06-27 MED ORDER — FAMOTIDINE 20 MG PO TABS: 20.0000 mg | ORAL_TABLET | Freq: Two times a day (BID) | ORAL | Status: DC | PRN

## 2020-06-27 NOTE — Progress Notes (Signed)
PROGRESS NOTE  Marcus Pham WPY:[id number] DOB: August 04, 1950 DOA: 06/26/2020 PCP: [name], MD  Brief History:  70 y.o. male with medical history significant of chronic kidney disease a stage IIIb, gastroesophageal flux disease, hyperlipidemia, hypertension, history of depression/anxiety and alcoholic liver cirrhosis; who presented to the hospital secondary to abnormal findings after recent outpatient paracentesis suggesting the presence of SBP.  Patient with elevated WBCs, worsening renal function and ascitic fluid demonstrating 843 WBC.  Patient denies fevers, chills, headache, chest pain, dyspnea, nausea, vomiting, diarrhea, abdominal pain, dysuria, hematuria, hematochezia, and melena. Patient states he does not take his lactulose as directed b/c of bothersome diarrhea.  He is compliant with all his other meds.     ED Course: Blood work in ED demonstrating WBCs of 14.4, stable hemoglobin level at 13.0 and platelets count of 117; comprehensive metabolic panel with a stable LFTs, renal function with creatinine of 1.87 and BUN of 34.  Patient with hyponatremia sodium 126 and chloride 94.  Case discussed with GI service who recommended admission for treatment of SBP and to provide IV albumin.  Patient is not meeting sepsis criteria at this time.  Assessment/Plan: SBP (spontaneous bacterial peritonitis) -Elevated WBCs, but no nausea, no vomiting, no abdominal pain, no fever. -Ascitic fluid results on 06/25/20 with 843 WBC (68% PMNs) -GI following -diuretics held 6/14 due to AKI -Patient has been encourage to follow low-sodium diet.  Hepatic encephalopathy -ammonia = 57 -daughter endorsed that pt's responses slower x 1 week -restart lactulose  AKI -baseline creatinine 1.0-1.1 -serum creatinine peaked 2.18 -held lasix/spiro on 6/14   Essential hypertension -Stable overall    Decompensated Hepatic cirrhosis (Marcus Pham) -recommendation by gastroenterology service. -LFTs are  stable. -Patient reports last drink was in November 2021.   thrombocytopenia -In the setting of liver cirrhosis -No overt bleeding appreciated -Holding heparin products and closely following platelets count. -SCDs for DVT prophylaxis.      Status is: Observation  The patient will require care spanning > 2 midnights and should be moved to inpatient because: Altered mental status  Dispo: The patient is from: Home              Anticipated d/c is to: Home              Patient currently is not medically stable to d/c.   Difficult to place patient No        Family Communication:   daughter updated 6/15  Consultants:  GI  Code Status:  FULL  DVT Prophylaxis:  SCDs   Procedures: As Listed in Progress Note Above  Antibiotics: Ceftriaxone 6/14      Subjective: Patient denies fevers, chills, headache, chest pain, dyspnea, nausea, vomiting, diarrhea, abdominal pain, dysuria, hematuria, hematochezia, and melena.   Objective: Vitals:   06/26/20 2045 06/27/20 0020 06/27/20 0314 06/27/20 0533  BP: 119/61 115/66  106/60  Pulse: 69 64  65  Resp: 18 18  18   Temp: 98.1 F (36.7 C) 97.6 F (36.4 C)  97.7 F (36.5 C)  TempSrc: Oral   Oral  SpO2: 99% 99%  98%  Weight:   98.4 kg   Height:        Intake/Output Summary (Last 24 hours) at 06/27/2020 1349 Last data filed at 06/27/2020 0539 Gross per 24 hour  Intake 656.7 ml  Output --  Net 656.7 ml   Weight change:  Exam:  General:  Pt is alert, follows commands appropriately,  not in acute distress HEENT: No icterus, No thrush, No neck mass, [place]/AT Cardiovascular: RRR, S1/S2, no rubs, no gallops Respiratory: CTA bilaterally, no wheezing, no crackles, no rhonchi Abdomen: Soft/+BS, non tender, non distended, no guarding Extremities: 1+ LE edema, No lymphangitis, No petechiae, No rashes, no synovitis   Data Reviewed: I have personally reviewed following labs and imaging studies Basic Metabolic Panel: Recent Labs   Lab 06/25/20 1112 06/26/20 1102 06/26/20 1758 06/27/20 0425  NA 130* 126*  --  131*  K 3.5 3.7  --  3.9  CL 95* 94*  --  97*  CO2 26 23  --  26  GLUCOSE 95 132*  --  87  BUN 36* 34*  --  30*  CREATININE 2.18* 1.87*  --  1.47*  CALCIUM 8.6 8.1*  --  8.3*  MG  --   --  2.2  --   PHOS  --   --  3.8  --    Liver Function Tests: Recent Labs  Lab 06/25/20 1112 06/26/20 1102 06/27/20 0425  AST 68* 77* 52*  ALT 31 34 24  ALKPHOS  --  226* 159*  BILITOT 6.8* 6.8* 6.0*  PROT 5.7* 5.7* 5.2*  ALBUMIN  --  2.1* 2.6*   No results for input(s): LIPASE, AMYLASE in the last 168 hours. Recent Labs  Lab 06/25/20 1112  AMMONIA 73*   Coagulation Profile: Recent Labs  Lab 06/25/20 1112 06/26/20 1102 06/27/20 0425  INR 1.5* 1.6* 2.0*   CBC: Recent Labs  Lab 06/25/20 1112 06/26/20 1102 06/27/20 0425  WBC 13.7* 14.4* 9.2  HGB 12.0* 13.0 11.2*  HCT 34.5* 39.3 35.5*  MCV 92.0 104.2* 109.6*  PLT 120* 117* 94*   Cardiac Enzymes: No results for input(s): CKTOTAL, CKMB, CKMBINDEX, TROPONINI in the last 168 hours. BNP: Invalid input(s): POCBNP CBG: No results for input(s): GLUCAP in the last 168 hours. HbA1C: No results for input(s): HGBA1C in the last 72 hours. Urine analysis: No results found for: COLORURINE, APPEARANCEUR, [place], [place], GLUCOSEU, [place], BILIRUBINUR, [place], [place], [place], NITRITE, LEUKOCYTESUR Sepsis Labs: @LABRCNTIP (procalcitonin:4,lacticidven:4) ) Recent Results (from the past 240 hour(s))  Culture, body fluid w Gram Stain-bottle     Status: None (Preliminary result)   Collection Time: 06/25/20 10:54 AM   Specimen: Ascitic  Result Value Ref Range Status   Specimen Description ASCITIC  Final   Special Requests 10CC  Final   Culture   Final    NO GROWTH 2 DAYS Performed at [facility], [address] [place], [place] [address]    Report Status PENDING  Incomplete  Gram stain     Status: None   Collection Time: 06/25/20 10:54 AM    Specimen: Ascitic  Result Value Ref Range Status   Specimen Description ASCITIC  Final   Special Requests NONE  Final   Gram Stain   Final    NO ORGANISMS SEEN WBC PRESENT, PREDOMINANTLY PMN Performed at [facility], [address], [place], [place] [address]    Report Status 06/25/2020 FINAL  Final  Blood culture (routine x 2)     Status: None (Preliminary result)   Collection Time: 06/26/20 11:07 AM   Specimen: BLOOD  Result Value Ref Range Status   Specimen Description BLOOD BLOOD RIGHT FOREARM  Final   Special Requests   Final    Immunocompromised Blood Culture results may not be optimal due to an inadequate volume of blood received in culture bottles BOTTLES DRAWN AEROBIC AND ANAEROBIC   Culture  Final    NO GROWTH < 24 HOURS Performed at [facility], [address] [place], [place] [address]    Report Status PENDING  Incomplete  Blood culture (routine x 2)     Status: None (Preliminary result)   Collection Time: 06/26/20  1:18 PM   Specimen: BLOOD  Result Value Ref Range Status   Specimen Description BLOOD  Final   Special Requests Immunocompromised  Final   Culture   Final    NO GROWTH < 24 HOURS Performed at [facility], [address] [place], [place] [address]    Report Status PENDING  Incomplete  SARS CORONAVIRUS 2 (Gavrielle Streck 6-24 HRS) Nasopharyngeal Nasopharyngeal Swab     Status: None   Collection Time: 06/26/20  1:51 PM   Specimen: Nasopharyngeal Swab  Result Value Ref Range Status   SARS Coronavirus 2 NEGATIVE NEGATIVE Final    Comment: (NOTE) SARS-CoV-2 target nucleic acids are NOT DETECTED.  The SARS-CoV-2 RNA is generally detectable in upper and lower respiratory specimens during the acute phase of infection. Negative results do not preclude SARS-CoV-2 infection, do not rule out co-infections with other pathogens, and should not be used as the sole basis for treatment or other patient management decisions. Negative results must be combined with clinical  observations, patient history, and epidemiological information. The expected result is Negative.  Fact Sheet for Patients: SugarRoll.be  Fact Sheet for Healthcare Providers: https://www.woods-mathews.com/  This test is not yet approved or cleared by the [place] FDA and  has been authorized for detection and/or diagnosis of SARS-CoV-2 by FDA under an Emergency Use Authorization (EUA). This EUA will remain  in effect (meaning this test can be used) for the duration of the COVID-19 declaration under Se ction 564(b)(1) of the Act, 21 U.S.C. section 360bbb-3(b)(1), unless the authorization is terminated or revoked sooner.  Performed at [facility], [place] [address] [place], [place] [address]      Scheduled Meds:  furosemide  20 mg Oral Daily   lactulose  20 g Oral BID   rifaximin  550 mg Oral BID   spironolactone  50 mg Oral Daily   Continuous Infusions:  cefTRIAXone (ROCEPHIN)  IV Stopped (06/26/20 1315)    Procedures/Studies: DG Chest 2 View  Result Date: 06/26/2020 CLINICAL DATA:  Cough.  Peritonitis EXAM: CHEST - 2 VIEW COMPARISON:  April 25, 2020 FINDINGS: There is mild scarring in the right mid lung and left base. There is interstitial thickening bilaterally, stable. No edema or airspace consolidation. Heart size and pulmonary vascularity are normal. No adenopathy. There is degenerative change in the thoracic spine. IMPRESSION: Scattered areas of scarring. Interstitial thickening, likely due to chronic bronchitis. No edema or airspace opacity. Stable cardiac silhouette. Electronically Signed   By: Lowella Grip III M.D.   On: 06/26/2020 18:54   US Paracentesis  Result Date: 06/25/2020 INDICATION: Cirrhosis Recurrent ascites EXAM: ULTRASOUND GUIDED RLQ PARACENTESIS MEDICATIONS: 10 cc 1% lidocaine COMPLICATIONS: None immediate. PROCEDURE: Informed written consent was obtained from the patient after a discussion of the  risks, benefits and alternatives to treatment. A timeout was performed prior to the initiation of the procedure. Initial ultrasound scanning demonstrates a large amount of ascites within the right lower abdominal quadrant. The right lower abdomen was prepped and draped in the usual sterile fashion. 1% lidocaine was used for local anesthesia. Following this, a Yueh catheter was introduced. An ultrasound image was saved for documentation purposes. The paracentesis was performed. The catheter was removed and  a dressing was applied. The patient tolerated the procedure well without immediate post procedural complication. Patient received post-procedure intravenous albumin; see nursing notes for details. FINDINGS: A total of approximately 3.3 liters of amber fluid was removed. Samples were sent to the laboratory as requested by the clinical team. IMPRESSION: Successful ultrasound-guided paracentesis yielding 3.3 liters of peritoneal fluid. Read by Lavonia Drafts [facility] Electronically Signed   By: Lavonia Dana M.D.   On: 06/25/2020 10:58    Orson Eva, DO  [facility]  If 7PM-7AM, please contact night-coverage [email or web address] Password [email or web address] 06/27/2020, 1:49 PM   LOS: 0 days

## 2020-06-27 NOTE — TOC Progression Note (Signed)
Transition of Care [facility]) - Progression Note    Patient Details  Name: Marcus Pham MRN: [id number] Date of Birth: [birth year]  Transition of Care [facility]) CM/SW Contact  Salome Arnt, [place] Phone Number: 06/27/2020, 8:14 AM  Clinical Narrative:  [place] notification completed. Notification ID: [id number].          Expected Discharge Plan and Services                                                 Social Determinants of Health (SDOH) Interventions    Readmission Risk Interventions No flowsheet data found.

## 2020-06-27 NOTE — Progress Notes (Signed)
Subjective:  Feels ok. No abdominal pain. Tolerated breakfast. Breathing improved. Reports typically only has 3-4 BMs per week. Tries to limit sodium in diet, discussed typical foods he avoids.   Objective: Vital signs in last 24 hours: Temp:  [97.5 F (36.4 C)-98.1 F (36.7 C)] 97.7 F (36.5 C) (06/15 0533) Pulse Rate:  [64-73] 65 (06/15 0533) Resp:  [13-29] 18 (06/15 0533) BP: (96-147)/(58-76) 106/60 (06/15 0533) SpO2:  [98 %-100 %] 98 % (06/15 0533) Weight:  [92.5 kg-98.4 kg] 98.4 kg (06/15 0314) Last BM Date: 06/26/20 General:   Alert,  elderly, chronically ill appearing male in NAD. Jaundiced. Head:  Normocephalic and atraumatic. Eyes:  Sclera clear, + icterus.  Abdomen:  Soft, mild distention. Nontender.  Normal bowel sounds, without guarding, and without rebound.   Extremities:  Without clubbing, deformity. 2+pitting edema to the knees bilaterally. Neurologic:  Alert and oriented x4;  grossly normal neurologically. Skin:  Intact without significant lesions or rashes. Psych:  Alert and cooperative. Normal mood and affect.  Intake/Output from previous day: 06/14 0701 - 06/15 0700 In: 420.7 [P.O.:300; IV Piggyback:120.7] Out: -  Intake/Output this shift: No intake/output data recorded.  Lab Results: CBC Recent Labs    06/25/20 1112 06/26/20 1102 06/27/20 0425  WBC 13.7* 14.4* 9.2  HGB 12.0* 13.0 11.2*  HCT 34.5* 39.3 35.5*  MCV 92.0 104.2* 109.6*  PLT 120* 117* 94*   BMET Recent Labs    06/25/20 1112 06/26/20 1102 06/27/20 0425  NA 130* 126* 131*  K 3.5 3.7 3.9  CL 95* 94* 97*  CO2 26 23 26   GLUCOSE 95 132* 87  BUN 36* 34* 30*  CREATININE 2.18* 1.87* 1.47*  CALCIUM 8.6 8.1* 8.3*   LFTs Recent Labs    06/25/20 1112 06/26/20 1102 06/27/20 0425  BILITOT 6.8* 6.8* 6.0*  ALKPHOS  --  226* 159*  AST 68* 77* 52*  ALT 31 34 24  PROT 5.7* 5.7* 5.2*  ALBUMIN  --  2.1* 2.6*   No results for input(s): LIPASE in the last 72 hours. PT/INR Recent Labs     06/25/20 1112 06/26/20 1102 06/27/20 0425  LABPROT 15.1* 19.4* 22.8*  INR 1.5* 1.6* 2.0*      Imaging Studies: DG Chest 2 View  Result Date: 06/26/2020 CLINICAL DATA:  Cough.  Peritonitis EXAM: CHEST - 2 VIEW COMPARISON:  April 25, 2020 FINDINGS: There is mild scarring in the right mid lung and left base. There is interstitial thickening bilaterally, stable. No edema or airspace consolidation. Heart size and pulmonary vascularity are normal. No adenopathy. There is degenerative change in the thoracic spine. IMPRESSION: Scattered areas of scarring. Interstitial thickening, likely due to chronic bronchitis. No edema or airspace opacity. Stable cardiac silhouette. Electronically Signed   By: Lowella Grip III M.D.   On: 06/26/2020 18:54   US Paracentesis  Result Date: 06/25/2020 INDICATION: Cirrhosis Recurrent ascites EXAM: ULTRASOUND GUIDED RLQ PARACENTESIS MEDICATIONS: 10 cc 1% lidocaine COMPLICATIONS: None immediate. PROCEDURE: Informed written consent was obtained from the patient after a discussion of the risks, benefits and alternatives to treatment. A timeout was performed prior to the initiation of the procedure. Initial ultrasound scanning demonstrates a large amount of ascites within the right lower abdominal quadrant. The right lower abdomen was prepped and draped in the usual sterile fashion. 1% lidocaine was used for local anesthesia. Following this, a Yueh catheter was introduced. An ultrasound image was saved for documentation purposes. The paracentesis was performed. The catheter was removed and a dressing  was applied. The patient tolerated the procedure well without immediate post procedural complication. Patient received post-procedure intravenous albumin; see nursing notes for details. FINDINGS: A total of approximately 3.3 liters of amber fluid was removed. Samples were sent to the laboratory as requested by the clinical team. IMPRESSION: Successful ultrasound-guided paracentesis  yielding 3.3 liters of peritoneal fluid. Read by Lavonia Drafts [facility] Electronically Signed   By: Lavonia Dana M.D.   On: 06/25/2020 10:58  [2 weeks]   Assessment: 70 year old male with history of suspected alcoholic cirrhosis, abstinent since 24/0973, complicated by hepatic encephalopathy, on Xifaxan and noncompliant with lactulose, and ascites on Lasix/Aldactone/requiring paras, now admitted with SBP with PMNs of 573 on paracentesis June 13.  Also with acute kidney injury, hyponatremia, increasing bilirubin, elevated INR at 1.5.  MELD up to 29 from 17, 2 months ago.  SBP: Paracentesis completed June 13 yielding 3.3 L.  PMNs 573.  Ascitic culture with no growth at 2 days.  Gram stain with no organisms.   White blood cell count 14,400 on presentation.  Was noted that his creatinine was up to 2.8, 1.03 a couple of months ago.  Blood cultures x2 negative at 24 hours. He was started on IV ceftriaxone 2 g daily.  Plans to complete 5-day course of antibiotics.  He will require SBP prophylaxis indefinitely.  Per protocol he was started on IV albumin 1.5 g/kg (maximum 100 g) on day 1 and 1 g/kg (maximum 100 g) on day 3.  Given self-limited reflux symptoms, advised to discontinue as needed PPI and use over-the-counter antacids due to risk of SBP with PPI. Clinically stable.  Cirrhosis: Decompensated with increase in MELD to 29.  Today down to 28.  INR increased up to 2.0 from 1.6 over the past 24 hours. Noted decreased oral intake over the past 1 to 2 weeks with feeling poorly with malaise, weakness, increased cough, recently completed antibiotics for bronchitis.  Per daughter there was issues with slurred speech/delayed responses over the last couple of days but this is improved. No overt encephalopathy, questionable mild asterixis on exam initially.  Ammonia level mildly elevated at 73.  Some increased peripheral edema over the past few weeks possibly due to noncompliance with low-sodium diet.  Diuretics were held  yesterday due to acute kidney injury.  Resumed at low-dose today, furosemide 20 mg daily/spironolactone 50 mg daily.  EGD up-to-date with no varices.  Plan: Complete 5 day course of IV ceftriaxone. He will require SBP prophylaxis indefinitely. IV 25% albumin.  1 g/kg (max 100 g) on day 3. Continue Xifaxan 550 mg twice daily. Lactulose 30 cc twice daily, titrate to 2-3 soft stools daily. 2 g sodium diet.  Patient declined nutrition consult, reporting being well versed regarding sodium restriction. Use H2 blocker or Tums as needed for GERD.  Discontinue PPI therapy. Daily labs.   Laureen Ochs. Bernarda Caffey [facility] [phone] 6/15/202211:56 AM    LOS: 0 days

## 2020-06-28 LAB — COMPREHENSIVE METABOLIC PANEL
ALT: 26 U/L (ref 0–44)
AST: 57 U/L — ABNORMAL HIGH (ref 15–41)
Albumin: 2.5 g/dL — ABNORMAL LOW (ref 3.5–5.0)
Alkaline Phosphatase: 153 U/L — ABNORMAL HIGH (ref 38–126)
Anion gap: 6 (ref 5–15)
BUN: 22 mg/dL (ref 8–23)
CO2: 26 mmol/L (ref 22–32)
Calcium: 8.1 mg/dL — ABNORMAL LOW (ref 8.9–10.3)
Chloride: 98 mmol/L (ref 98–111)
Creatinine, Ser: 1.09 mg/dL (ref 0.61–1.24)
GFR, Estimated: >60 mL/min (ref >60)
Glucose, Bld: 89 mg/dL (ref 70–99)
Potassium: 3.5 mmol/L (ref 3.5–5.1)
Sodium: 130 mmol/L — ABNORMAL LOW (ref 135–145)
Total Bilirubin: 7.9 mg/dL — ABNORMAL HIGH (ref 0.3–1.2)
Total Protein: 5.3 g/dL — ABNORMAL LOW (ref 6.5–8.1)

## 2020-06-28 LAB — CBC
HCT: 32.4 % — ABNORMAL LOW (ref 39.0–52.0)
Hemoglobin: 11.1 g/dL — ABNORMAL LOW (ref 13.0–17.0)
MCH: 34.7 pg — ABNORMAL HIGH (ref 26.0–34.0)
MCHC: 34.3 g/dL (ref 30.0–36.0)
MCV: 101.3 fL — ABNORMAL HIGH (ref 80.0–100.0)
Platelets: 99 10*3/uL — ABNORMAL LOW (ref 150–400)
RBC: 3.2 MIL/uL — ABNORMAL LOW (ref 4.22–5.81)
RDW: 14.4 % (ref 11.5–15.5)
WBC: 12.4 10*3/uL — ABNORMAL HIGH (ref 4.0–10.5)
nRBC: 0 % (ref 0.0–0.2)

## 2020-06-28 LAB — PROTIME-INR
INR: 1.8 — ABNORMAL HIGH (ref 0.8–1.2)
Prothrombin Time: 21 seconds — ABNORMAL HIGH (ref 11.4–15.2)

## 2020-06-28 LAB — AMMONIA: Ammonia: 53 umol/L — ABNORMAL HIGH (ref 9–35)

## 2020-06-28 MED ORDER — ALBUMIN HUMAN 25 % IV SOLN
25.0000 g | Freq: Four times a day (QID) | INTRAVENOUS | Status: AC
  Administered 2020-06-28 – 2020-06-29 (×4): 25 g via INTRAVENOUS
  Filled 2020-06-28 (×4): qty 100

## 2020-06-28 NOTE — Progress Notes (Signed)
Pt ambulated a total of 110 feet in hallway with only standby assistance, tolerated well. Denies any complaint at present. IV albumin infusing per order. Pt eating dinner at this time.

## 2020-06-28 NOTE — Progress Notes (Signed)
PROGRESS NOTE  Marcus FORDHAM FYB:[id number] DOB: Oct 20, 1950 DOA: 06/26/2020 PCP: [name], MD  Brief History:  70 y.o. male with medical history significant of chronic kidney disease a stage IIIb, gastroesophageal flux disease, hyperlipidemia, hypertension, history of depression/anxiety and alcoholic liver cirrhosis; who presented to the hospital secondary to abnormal findings after recent outpatient paracentesis suggesting the presence of SBP.  Patient with elevated WBCs, worsening renal function and ascitic fluid demonstrating 843 WBC.  Patient denies fevers, chills, headache, chest pain, dyspnea, nausea, vomiting, diarrhea, abdominal pain, dysuria, hematuria, hematochezia, and melena. Patient states he does not take his lactulose as directed b/c of bothersome diarrhea.  He is compliant with all his other meds.      ED Course: Blood work in ED demonstrating WBCs of 14.4, stable hemoglobin level at 13.0 and platelets count of 117; comprehensive metabolic panel with a stable LFTs, renal function with creatinine of 1.87 and BUN of 34.  Patient with hyponatremia sodium 126 and chloride 94.  Case discussed with GI service who recommended admission for treatment of SBP and to provide IV albumin.  Patient is not meeting sepsis criteria at this time.   Assessment/Plan: SBP (spontaneous bacterial peritonitis) -Elevated WBCs, but no nausea, no vomiting, no abdominal pain, no fever. -Ascitic fluid results on 06/25/20 with 843 WBC (68% PMNs) -GI following -diuretics held 6/14 due to AKI -6/15--lasix/spiro restarted at half dose -Patient has been encourage to follow low-sodium diet.   Hepatic encephalopathy -ammonia = 73>>53 -daughter endorsed that pt's responses slower x 1 week -restarted lactulose 6/14   AKI -baseline creatinine 1.0-1.1 -serum creatinine peaked 2.18 -held lasix/spiro on 6/14 -06/27/20 lasix/spiro half dose restarted   Essential hypertension -Stable overall     Decompensated Hepatic cirrhosis ([facility]) -recommendation by gastroenterology service. -LFTs are stable. -Patient reports last drink was in November 2021.   thrombocytopenia -In the setting of liver cirrhosis -No overt bleeding appreciated -Holding heparin products and closely following platelets count. -SCDs for DVT prophylaxis.           Status is: Inpatient   The patient will require care spanning > 2 midnights and should be moved to inpatient because: Altered mental status      Dispo: The patient is from: Home              Anticipated d/c is to: Home              Patient currently is not medically stable to d/c.              Difficult to place patient No  Dispo--d/c home 06/29/20 if cleared by GI               Family Communication:   daughter updated at bedside 6/16   Consultants:  renal; palliative   Code Status:  DNI   DVT Prophylaxis:  SCDs     Procedures: As Listed in Progress Note Above   Antibiotics: Ceftriaxone 6/14>>      Subjective: Patient denies fevers, chills, headache, chest pain, dyspnea, nausea, vomiting, diarrhea, abdominal pain, dysuria, hematuria, hematochezia, and melena.   Objective: Vitals:   06/27/20 2243 06/28/20 0330 06/28/20 0500 06/28/20 1410  BP: 117/62 119/62  110/63  Pulse: 65 70  68  Resp: 20 19  18   Temp: 98.2 F (36.8 C) 97.6 F (36.4 C)  98.1 F (36.7 C)  TempSrc: Oral   Oral  SpO2: 98% 97%  99%  Weight:   98.4 kg   Height:        Intake/Output Summary (Last 24 hours) at 06/28/2020 1745 Last data filed at 06/28/2020 1546 Gross per 24 hour  Intake 1181.21 ml  Output --  Net 1181.21 ml   Weight change: 5.897 kg Exam:  General:  Pt is alert, follows commands appropriately, not in acute distress HEENT: No icterus, No thrush, No neck mass, [place]/AT Cardiovascular: RRR, S1/S2, no rubs, no gallops Respiratory: CTA bilaterally, no wheezing, no crackles, no rhonchi Abdomen: Soft/+BS, non tender, non distended,  no guarding Extremities: 2+LE edema, No lymphangitis, No petechiae, No rashes, no synovitis   Data Reviewed: I have personally reviewed following labs and imaging studies Basic Metabolic Panel: Recent Labs  Lab 06/25/20 1112 06/26/20 1102 06/26/20 1758 06/27/20 0425 06/28/20 0535  NA 130* 126*  --  131* 130*  K 3.5 3.7  --  3.9 3.5  CL 95* 94*  --  97* 98  CO2 26 23  --  26 26  GLUCOSE 95 132*  --  87 89  BUN 36* 34*  --  30* 22  CREATININE 2.18* 1.87*  --  1.47* 1.09  CALCIUM 8.6 8.1*  --  8.3* 8.1*  MG  --   --  2.2  --   --   PHOS  --   --  3.8  --   --    Liver Function Tests: Recent Labs  Lab 06/25/20 1112 06/26/20 1102 06/27/20 0425 06/28/20 0535  AST 68* 77* 52* 57*  ALT 31 34 24 26  ALKPHOS  --  226* 159* 153*  BILITOT 6.8* 6.8* 6.0* 7.9*  PROT 5.7* 5.7* 5.2* 5.3*  ALBUMIN  --  2.1* 2.6* 2.5*   No results for input(s): LIPASE, AMYLASE in the last 168 hours. Recent Labs  Lab 06/25/20 1112 06/28/20 0535  AMMONIA 73* 53*   Coagulation Profile: Recent Labs  Lab 06/25/20 1112 06/26/20 1102 06/27/20 0425 06/28/20 0535  INR 1.5* 1.6* 2.0* 1.8*   CBC: Recent Labs  Lab 06/25/20 1112 06/26/20 1102 06/27/20 0425 06/28/20 0535  WBC 13.7* 14.4* 9.2 12.4*  HGB 12.0* 13.0 11.2* 11.1*  HCT 34.5* 39.3 35.5* 32.4*  MCV 92.0 104.2* 109.6* 101.3*  PLT 120* 117* 94* 99*   Cardiac Enzymes: No results for input(s): CKTOTAL, CKMB, CKMBINDEX, TROPONINI in the last 168 hours. BNP: Invalid input(s): POCBNP CBG: No results for input(s): GLUCAP in the last 168 hours. HbA1C: No results for input(s): HGBA1C in the last 72 hours. Urine analysis: No results found for: COLORURINE, APPEARANCEUR, LABSPEC, [place], GLUCOSEU, HGBUR, BILIRUBINUR, KETONESUR, PROTEINUR, UROBILINOGEN, NITRITE, LEUKOCYTESUR Sepsis Labs: @LABRCNTIP (procalcitonin:4,lacticidven:4) ) Recent Results (from the past 240 hour(s))  Culture, body fluid w Gram Stain-bottle     Status: None  (Preliminary result)   Collection Time: 06/25/20 10:54 AM   Specimen: Ascitic  Result Value Ref Range Status   Specimen Description ASCITIC  Final   Special Requests 10CC  Final   Culture   Final    NO GROWTH 3 DAYS Performed at [facility], [address], [place],  [address]    Report Status PENDING  Incomplete  Gram stain     Status: None   Collection Time: 06/25/20 10:54 AM   Specimen: Ascitic  Result Value Ref Range Status   Specimen Description ASCITIC  Final   Special Requests NONE  Final   Gram Stain   Final    NO ORGANISMS SEEN WBC PRESENT, PREDOMINANTLY PMN Performed at  [facility], [address], [place], [place] [address]    Report Status 06/25/2020 FINAL  Final  Blood culture (routine x 2)     Status: None (Preliminary result)   Collection Time: 06/26/20 11:07 AM   Specimen: BLOOD  Result Value Ref Range Status   Specimen Description BLOOD BLOOD RIGHT FOREARM  Final   Special Requests   Final    Immunocompromised Blood Culture results may not be optimal due to an inadequate volume of blood received in culture bottles BOTTLES DRAWN AEROBIC AND ANAEROBIC   Culture   Final    NO GROWTH 2 DAYS Performed at [facility], [address], [place], [place] [address]    Report Status PENDING  Incomplete  Blood culture (routine x 2)     Status: None (Preliminary result)   Collection Time: 06/26/20  1:18 PM   Specimen: BLOOD  Result Value Ref Range Status   Specimen Description BLOOD  Final   Special Requests Immunocompromised  Final   Culture   Final    NO GROWTH 2 DAYS Performed at [facility], [address] [place], [place] [address]    Report Status PENDING  Incomplete  SARS CORONAVIRUS 2 (Jeoffrey Eleazer 6-24 HRS) Nasopharyngeal Nasopharyngeal Swab     Status: None   Collection Time: 06/26/20  1:51 PM   Specimen: Nasopharyngeal Swab  Result Value Ref Range Status   SARS Coronavirus 2 NEGATIVE NEGATIVE Final    Comment: (NOTE) SARS-CoV-2 target nucleic acids are  NOT DETECTED.  The SARS-CoV-2 RNA is generally detectable in upper and lower respiratory specimens during the acute phase of infection. Negative results do not preclude SARS-CoV-2 infection, do not rule out co-infections with other pathogens, and should not be used as the sole basis for treatment or other patient management decisions. Negative results must be combined with clinical observations, patient history, and epidemiological information. The expected result is Negative.  Fact Sheet for Patients: SugarRoll.be  Fact Sheet for Healthcare Providers: https://www.woods-mathews.com/  This test is not yet approved or cleared by the [place] FDA and  has been authorized for detection and/or diagnosis of SARS-CoV-2 by FDA under an Emergency Use Authorization (EUA). This EUA will remain  in effect (meaning this test can be used) for the duration of the COVID-19 declaration under Se ction 564(b)(1) of the Act, 21 U.S.C. section 360bbb-3(b)(1), unless the authorization is terminated or revoked sooner.  Performed at [facility], [place] [address], [place], [place] [address]      Scheduled Meds:  furosemide  20 mg Oral Daily   lactulose  20 g Oral BID   rifaximin  550 mg Oral BID   spironolactone  50 mg Oral Daily   Continuous Infusions:  albumin human 25 g (06/28/20 1722)   cefTRIAXone (ROCEPHIN)  IV 2 g (06/28/20 1546)    Procedures/Studies: DG Chest 2 View  Result Date: 06/26/2020 CLINICAL DATA:  Cough.  Peritonitis EXAM: CHEST - 2 VIEW COMPARISON:  April 25, 2020 FINDINGS: There is mild scarring in the right mid lung and left base. There is interstitial thickening bilaterally, stable. No edema or airspace consolidation. Heart size and pulmonary vascularity are normal. No adenopathy. There is degenerative change in the thoracic spine. IMPRESSION: Scattered areas of scarring. Interstitial thickening, likely due to chronic  bronchitis. No edema or airspace opacity. Stable cardiac silhouette. Electronically Signed   By: Lowella Grip III M.D.   On: 06/26/2020 18:54   US Paracentesis  Result Date: 06/25/2020 INDICATION: Cirrhosis  Recurrent ascites EXAM: ULTRASOUND GUIDED RLQ PARACENTESIS MEDICATIONS: 10 cc 1% lidocaine COMPLICATIONS: None immediate. PROCEDURE: Informed written consent was obtained from the patient after a discussion of the risks, benefits and alternatives to treatment. A timeout was performed prior to the initiation of the procedure. Initial ultrasound scanning demonstrates a large amount of ascites within the right lower abdominal quadrant. The right lower abdomen was prepped and draped in the usual sterile fashion. 1% lidocaine was used for local anesthesia. Following this, a Yueh catheter was introduced. An ultrasound image was saved for documentation purposes. The paracentesis was performed. The catheter was removed and a dressing was applied. The patient tolerated the procedure well without immediate post procedural complication. Patient received post-procedure intravenous albumin; see nursing notes for details. FINDINGS: A total of approximately 3.3 liters of amber fluid was removed. Samples were sent to the laboratory as requested by the clinical team. IMPRESSION: Successful ultrasound-guided paracentesis yielding 3.3 liters of peritoneal fluid. Read by Lavonia Drafts [facility] Electronically Signed   By: Lavonia Dana M.D.   On: 06/25/2020 10:58    Orson Eva, DO  [facility]  If 7PM-7AM, please contact night-coverage [email or web address] Password [email or web address] 06/28/2020, 5:45 PM   LOS: 1 day

## 2020-06-28 NOTE — Progress Notes (Signed)
Subjective:  Feels better.  No nausea or vomiting.  No abdominal pain.  Had large bowel movement last night.  No melena or rectal bleeding.  Objective: Vital signs in last 24 hours: Temp:  [97.6 F (36.4 C)-98.2 F (36.8 C)] 97.6 F (36.4 C) (06/16 0330) Pulse Rate:  [65-70] 70 (06/16 0330) Resp:  [18-20] 19 (06/16 0330) BP: (114-119)/(57-62) 119/62 (06/16 0330) SpO2:  [97 %-99 %] 97 % (06/16 0330) Weight:  [98.4 kg] 98.4 kg (06/16 0500) Last BM Date: 06/27/20 General:   Alert,  Well-developed, well-nourished, pleasant and cooperative in NAD Head:  Normocephalic and atraumatic. Eyes:  Sclera clear, + icterus.  Abdomen:  Soft, nontender and nondistended.  Normal bowel sounds, without guarding, and without rebound.   Extremities:  Without clubbing, deformity.  2+ pitting edema to the knees bilaterally. Neurologic:  Alert and  oriented x4;  grossly normal neurologically. Psych:  Alert and cooperative. Normal mood and affect.  Intake/Output from previous day: 06/15 0701 - 06/16 0700 In: 30 [P.O.:956] Out: -  Intake/Output this shift: Total I/O In: 356 [P.O.:356] Out: -   Lab Results: CBC Recent Labs    06/26/20 1102 06/27/20 0425 06/28/20 0535  WBC 14.4* 9.2 12.4*  HGB 13.0 11.2* 11.1*  HCT 39.3 35.5* 32.4*  MCV 104.2* 109.6* 101.3*  PLT 117* 94* 99*   BMET Recent Labs    06/26/20 1102 06/27/20 0425 06/28/20 0535  NA 126* 131* 130*  K 3.7 3.9 3.5  CL 94* 97* 98  CO2 23 26 26   GLUCOSE 132* 87 89  BUN 34* 30* 22  CREATININE 1.87* 1.47* 1.09  CALCIUM 8.1* 8.3* 8.1*   LFTs Recent Labs    06/26/20 1102 06/27/20 0425 06/28/20 0535  BILITOT 6.8* 6.0* 7.9*  ALKPHOS 226* 159* 153*  AST 77* 52* 57*  ALT 34 24 26  PROT 5.7* 5.2* 5.3*  ALBUMIN 2.1* 2.6* 2.5*   No results for input(s): LIPASE in the last 72 hours. PT/INR Recent Labs    06/26/20 1102 06/27/20 0425 06/28/20 0535  LABPROT 19.4* 22.8* 21.0*  INR 1.6* 2.0* 1.8*      Imaging Studies: DG  Chest 2 View  Result Date: 06/26/2020 CLINICAL DATA:  Cough.  Peritonitis EXAM: CHEST - 2 VIEW COMPARISON:  April 25, 2020 FINDINGS: There is mild scarring in the right mid lung and left base. There is interstitial thickening bilaterally, stable. No edema or airspace consolidation. Heart size and pulmonary vascularity are normal. No adenopathy. There is degenerative change in the thoracic spine. IMPRESSION: Scattered areas of scarring. Interstitial thickening, likely due to chronic bronchitis. No edema or airspace opacity. Stable cardiac silhouette. Electronically Signed   By: Lowella Grip III M.D.   On: 06/26/2020 18:54   US Paracentesis  Result Date: 06/25/2020 INDICATION: Cirrhosis Recurrent ascites EXAM: ULTRASOUND GUIDED RLQ PARACENTESIS MEDICATIONS: 10 cc 1% lidocaine COMPLICATIONS: None immediate. PROCEDURE: Informed written consent was obtained from the patient after a discussion of the risks, benefits and alternatives to treatment. A timeout was performed prior to the initiation of the procedure. Initial ultrasound scanning demonstrates a large amount of ascites within the right lower abdominal quadrant. The right lower abdomen was prepped and draped in the usual sterile fashion. 1% lidocaine was used for local anesthesia. Following this, a Yueh catheter was introduced. An ultrasound image was saved for documentation purposes. The paracentesis was performed. The catheter was removed and a dressing was applied. The patient tolerated the procedure well without immediate post procedural complication. Patient  received post-procedure intravenous albumin; see nursing notes for details. FINDINGS: A total of approximately 3.3 liters of amber fluid was removed. Samples were sent to the laboratory as requested by the clinical team. IMPRESSION: Successful ultrasound-guided paracentesis yielding 3.3 liters of peritoneal fluid. Read by Lavonia Drafts [facility] Electronically Signed   By: Lavonia Dana M.D.   On:  06/25/2020 10:58  [2 weeks]   Assessment: 70 year old male with history of suspected alcoholic cirrhosis, abstinent since March 1610, complicated by hepatic encephalopathy, on Xifaxan and lactulose, and ascites on Lasix/Aldactone/requiring paras, now admitted with SBP with PMNs of 573 on paracentesis June 13.  Also with acute kidney injury, hyponatremia, increasing bilirubin, elevated INR 1.5. MELD up to 29 from 17, 2 months ago.  SBP: Paracentesis completed June 13 yielding 3.3 L.  PMNs 573.  Ascitic culture and blood cultures no growth so far.  Gram stain with no organisms.  Creatinine was up to 2.8, 1.03 a couple months ago.  Creatinine has now returned to normal.  Diuretics have been held temporarily but restarted at 50% this admission.  On IV ceftriaxone 2 g daily.  Plans to complete 5-day course of antibiotics.  He will require SBP prophylaxis indefinitely, Cipro 500 mg daily.  Per protocol he was given IV albumin on day 1, scheduled to receive 1 g/kg today (day 3).  PPI was switched over to H2 blocker due to risk of SBP with PPI.  Cirrhosis: Decompensated with increase in MELD to 29.  MELD down to 26 today.  INR improved to 1.8, total bilirubin increased further from 6-7.9.  At this time no overt encephalopathy, questionable encephalopathy prior to admission.  He has had some increased peripheral edema over the past several weeks.  Diuretics held on admission, resumed at low-dose, furosemide 20 mg daily, spironolactone 50 mg daily.  EGD currently up-to-date with no varices.    Plan: Completed 5-day course of antibiotics. At time of discharge, Cipro 500 mg daily indefinitely for SBP prophylaxis. IV albumin today. Continues Xifaxan 500 mg twice daily. Continue lactulose, titrate to 2-3 soft stools daily. 2 g sodium restricted diet. Treat GERD with H2 blockers or over-the-counter antacids.  Avoid PPI therapy. Repeat labs tomorrow  Laureen Ochs. Bernarda Caffey [facility] [phone] 6/16/20229:55 AM    LOS: 1 day

## 2020-06-29 ENCOUNTER — Encounter (HOSPITAL_COMMUNITY): Payer: Self-pay | Admitting: Internal Medicine

## 2020-06-29 ENCOUNTER — Inpatient Hospital Stay (HOSPITAL_COMMUNITY): Payer: No Typology Code available for payment source

## 2020-06-29 ENCOUNTER — Telehealth: Payer: Self-pay

## 2020-06-29 DIAGNOSIS — K766 Portal hypertension: Secondary | ICD-10-CM

## 2020-06-29 DIAGNOSIS — D649 Anemia, unspecified: Secondary | ICD-10-CM

## 2020-06-29 DIAGNOSIS — R17 Unspecified jaundice: Secondary | ICD-10-CM

## 2020-06-29 LAB — COMPREHENSIVE METABOLIC PANEL
ALT: 22 U/L (ref 0–44)
AST: 47 U/L — ABNORMAL HIGH (ref 15–41)
Albumin: 3 g/dL — ABNORMAL LOW (ref 3.5–5.0)
Alkaline Phosphatase: 130 U/L — ABNORMAL HIGH (ref 38–126)
Anion gap: 5 (ref 5–15)
BUN: 18 mg/dL (ref 8–23)
CO2: 26 mmol/L (ref 22–32)
Calcium: 8.1 mg/dL — ABNORMAL LOW (ref 8.9–10.3)
Chloride: 98 mmol/L (ref 98–111)
Creatinine, Ser: 0.91 mg/dL (ref 0.61–1.24)
GFR, Estimated: >60 mL/min (ref >60)
Glucose, Bld: 105 mg/dL — ABNORMAL HIGH (ref 70–99)
Potassium: 3.5 mmol/L (ref 3.5–5.1)
Sodium: 129 mmol/L — ABNORMAL LOW (ref 135–145)
Total Bilirubin: 7.3 mg/dL — ABNORMAL HIGH (ref 0.3–1.2)
Total Protein: 5.2 g/dL — ABNORMAL LOW (ref 6.5–8.1)

## 2020-06-29 LAB — CBC
HCT: 28.7 % — ABNORMAL LOW (ref 39.0–52.0)
Hemoglobin: 9.9 g/dL — ABNORMAL LOW (ref 13.0–17.0)
MCH: 35.1 pg — ABNORMAL HIGH (ref 26.0–34.0)
MCHC: 34.5 g/dL (ref 30.0–36.0)
MCV: 101.8 fL — ABNORMAL HIGH (ref 80.0–100.0)
Platelets: 83 10*3/uL — ABNORMAL LOW (ref 150–400)
RBC: 2.82 MIL/uL — ABNORMAL LOW (ref 4.22–5.81)
RDW: 14.3 % (ref 11.5–15.5)
WBC: 11.3 10*3/uL — ABNORMAL HIGH (ref 4.0–10.5)
nRBC: 0 % (ref 0.0–0.2)

## 2020-06-29 LAB — PROTIME-INR
INR: 2.2 — ABNORMAL HIGH (ref 0.8–1.2)
Prothrombin Time: 24.5 seconds — ABNORMAL HIGH (ref 11.4–15.2)

## 2020-06-29 MED ORDER — LACTULOSE 10 GM/15ML PO SOLN
30.0000 g | Freq: Two times a day (BID) | ORAL | Status: DC
  Administered 2020-06-29 – 2020-06-30 (×2): 30 g via ORAL
  Filled 2020-06-29 (×2): qty 60

## 2020-06-29 MED ORDER — VITAMIN K1 10 MG/ML IJ SOLN
10.0000 mg | Freq: Once | INTRAVENOUS | Status: AC
  Administered 2020-06-29: 10 mg via INTRAVENOUS
  Filled 2020-06-29: qty 1

## 2020-06-29 NOTE — Progress Notes (Signed)
PROGRESS NOTE  Marcus Pham OAC:[id number] DOB: [birth year] DOA: 06/26/2020 PCP: [name], MD Brief History:  70 y.o. male with medical history significant of chronic kidney disease a stage IIIb, gastroesophageal flux disease, hyperlipidemia, hypertension, history of depression/anxiety and alcoholic liver cirrhosis; who presented to the hospital secondary to abnormal findings after recent outpatient paracentesis suggesting the presence of SBP.  Patient with elevated WBCs, worsening renal function and ascitic fluid demonstrating 843 WBC.  Patient denies fevers, chills, headache, chest pain, dyspnea, nausea, vomiting, diarrhea, abdominal pain, dysuria, hematuria, hematochezia, and melena. Patient states he does not take his lactulose as directed b/c of bothersome diarrhea.  He is compliant with all his other meds.      ED Course: Blood work in ED demonstrating WBCs of 14.4, stable hemoglobin level at 13.0 and platelets count of 117; comprehensive metabolic panel with a stable LFTs, renal function with creatinine of 1.87 and BUN of 34.  Patient with hyponatremia sodium 126 and chloride 94.  Case discussed with GI service who recommended admission for treatment of SBP and to provide IV albumin.  Patient is not meeting sepsis criteria at this time.   Assessment/Plan: SBP (spontaneous bacterial peritonitis) -Elevated WBCs, but no nausea, no vomiting, no abdominal pain, no fever. -Ascitic fluid results on 06/25/20 with 843 WBC (68% PMNs) -GI following -diuretics held 6/14 due to AKI -6/15--lasix/spiro restarted at half dose -Patient has been encourage to follow low-sodium diet.   Hepatic encephalopathy -ammonia = 73>>53 -daughter endorsed that pt's responses slower x 1 week -restarted lactulose 6/14   AKI -baseline creatinine 1.0-1.1 -serum creatinine peaked 2.18 -held lasix/spiro on 6/14 -06/27/20 lasix/spiro half dose restarted-->renal function stable   Essential  hypertension -Stable overall    Decompensated Hepatic cirrhosis ([place]) -recommendation by gastroenterology service. -LFTs are stable. -Patient reports last drink was in November 2021.   thrombocytopenia -In the setting of liver cirrhosis -No overt bleeding appreciated -Holding heparin products and closely following platelets count. -SCDs for DVT prophylaxis.           Status is: Inpatient   The patient will require care spanning > 2 midnights and should be moved to inpatient because: Altered mental status       Dispo: The patient is from: Home              Anticipated d/c is to: Home              Patient currently is not medically stable to d/c.              Difficult to place patient No             Dispo--d/c home 06/30/20 if cleared by GI               Family Communication:   daughter updated 6/17   Consultants:  renal; palliative   Code Status:  DNI   DVT Prophylaxis:  SCDs     Procedures: As Listed in Progress Note Above   Antibiotics: Ceftriaxone 6/14>>        Subjective: Patient denies fevers, chills, headache, chest pain, dyspnea, nausea, vomiting, diarrhea, abdominal pain, dysuria, hematuria, hematochezia, and melena. Had 1 BM in last 24 hours  Objective: Vitals:   06/28/20 0500 06/28/20 1410 06/28/20 2041 06/29/20 0500  BP:  110/63 103/64 106/66  Pulse:  68 70 75  Resp:  18 20 20   Temp:  98.1 F (36.7 C) 98.4  F (36.9 C) 98.1 F (36.7 C)  TempSrc:  Oral Oral   SpO2:  99% 96%   Weight: 98.4 kg   98 kg  Height:        Intake/Output Summary (Last 24 hours) at 06/29/2020 1719 Last data filed at 06/29/2020 1500 Gross per 24 hour  Intake 701.67 ml  Output 350 ml  Net 351.67 ml   Weight change: -0.431 kg Exam:  General:  Pt is alert, follows commands appropriately, not in acute distress HEENT: No icterus, No thrush, No neck mass, [place]/AT Cardiovascular: RRR, S1/S2, no rubs, no gallops Respiratory: bibasilar rales. No wheeze Abdomen:  Soft/+BS, non tender, non distended, no guarding Extremities: 1+LE edema, No lymphangitis, No petechiae, No rashes, no synovitis   Data Reviewed: I have personally reviewed following labs and imaging studies Basic Metabolic Panel: Recent Labs  Lab 06/25/20 1112 06/26/20 1102 06/26/20 1758 06/27/20 0425 06/28/20 0535 06/29/20 0616  NA 130* 126*  --  131* 130* 129*  K 3.5 3.7  --  3.9 3.5 3.5  CL 95* 94*  --  97* 98 98  CO2 26 23  --  26 26 26   GLUCOSE 95 132*  --  87 89 105*  BUN 36* 34*  --  30* 22 18  CREATININE 2.18* 1.87*  --  1.47* 1.09 0.91  CALCIUM 8.6 8.1*  --  8.3* 8.1* 8.1*  MG  --   --  2.2  --   --   --   PHOS  --   --  3.8  --   --   --    Liver Function Tests: Recent Labs  Lab 06/25/20 1112 06/26/20 1102 06/27/20 0425 06/28/20 0535 06/29/20 0616  AST 68* 77* 52* 57* 47*  ALT 31 34 24 26 22   ALKPHOS  --  226* 159* 153* 130*  BILITOT 6.8* 6.8* 6.0* 7.9* 7.3*  PROT 5.7* 5.7* 5.2* 5.3* 5.2*  ALBUMIN  --  2.1* 2.6* 2.5* 3.0*   No results for input(s): LIPASE, AMYLASE in the last 168 hours. Recent Labs  Lab 06/25/20 1112 06/28/20 0535  AMMONIA 73* 53*   Coagulation Profile: Recent Labs  Lab 06/25/20 1112 06/26/20 1102 06/27/20 0425 06/28/20 0535 06/29/20 0616  INR 1.5* 1.6* 2.0* 1.8* 2.2*   CBC: Recent Labs  Lab 06/25/20 1112 06/26/20 1102 06/27/20 0425 06/28/20 0535 06/29/20 0616  WBC 13.7* 14.4* 9.2 12.4* 11.3*  HGB 12.0* 13.0 11.2* 11.1* 9.9*  HCT 34.5* 39.3 35.5* 32.4* 28.7*  MCV 92.0 104.2* 109.6* 101.3* 101.8*  PLT 120* 117* 94* 99* 83*   Cardiac Enzymes: No results for input(s): CKTOTAL, CKMB, CKMBINDEX, TROPONINI in the last 168 hours. BNP: Invalid input(s): POCBNP CBG: No results for input(s): GLUCAP in the last 168 hours. HbA1C: No results for input(s): HGBA1C in the last 72 hours. Urine analysis: No results found for: COLORURINE, APPEARANCEUR, LABSPEC, [place], GLUCOSEU, HGBUR, BILIRUBINUR, KETONESUR, PROTEINUR,  UROBILINOGEN, NITRITE, LEUKOCYTESUR Sepsis Labs: @LABRCNTIP (procalcitonin:4,lacticidven:4) ) Recent Results (from the past 240 hour(s))  Culture, body fluid w Gram Stain-bottle     Status: None (Preliminary result)   Collection Time: 06/25/20 10:54 AM   Specimen: Ascitic  Result Value Ref Range Status   Specimen Description ASCITIC  Final   Special Requests 10CC  Final   Culture   Final    NO GROWTH 4 DAYS Performed at [facility], [address] [place], [place] [address]    Report Status PENDING  Incomplete  Gram stain  Status: None   Collection Time: 06/25/20 10:54 AM   Specimen: Ascitic  Result Value Ref Range Status   Specimen Description ASCITIC  Final   Special Requests NONE  Final   Gram Stain   Final    NO ORGANISMS SEEN WBC PRESENT, PREDOMINANTLY PMN Performed at [facility], [address] [place], [place] [address]    Report Status 06/25/2020 FINAL  Final  Blood culture (routine x 2)     Status: None (Preliminary result)   Collection Time: 06/26/20 11:07 AM   Specimen: BLOOD  Result Value Ref Range Status   Specimen Description BLOOD BLOOD RIGHT FOREARM  Final   Special Requests   Final    Immunocompromised Blood Culture results may not be optimal due to an inadequate volume of blood received in culture bottles BOTTLES DRAWN AEROBIC AND ANAEROBIC   Culture   Final    NO GROWTH 3 DAYS Performed at [facility], [address] [place], [place] [address]    Report Status PENDING  Incomplete  Blood culture (routine x 2)     Status: None (Preliminary result)   Collection Time: 06/26/20  1:18 PM   Specimen: BLOOD  Result Value Ref Range Status   Specimen Description BLOOD  Final   Special Requests Immunocompromised  Final   Culture   Final    NO GROWTH 3 DAYS Performed at [facility], [address] [place], [place] [address]    Report Status PENDING  Incomplete  SARS CORONAVIRUS 2 ([place] Exline 6-24 HRS) Nasopharyngeal Nasopharyngeal Swab     Status: None    Collection Time: 06/26/20  1:51 PM   Specimen: Nasopharyngeal Swab  Result Value Ref Range Status   SARS Coronavirus 2 NEGATIVE NEGATIVE Final    Comment: (NOTE) SARS-CoV-2 target nucleic acids are NOT DETECTED.  The SARS-CoV-2 RNA is generally detectable in upper and lower respiratory specimens during the acute phase of infection. Negative results do not preclude SARS-CoV-2 infection, do not rule out co-infections with other pathogens, and should not be used as the sole basis for treatment or other patient management decisions. Negative results must be combined with clinical observations, patient history, and epidemiological information. The expected result is Negative.  Fact Sheet for Patients: SugarRoll.be  Fact Sheet for Healthcare Providers: https://www.woods-mathews.com/  This test is not yet approved or cleared by the [place] FDA and  has been authorized for detection and/or diagnosis of SARS-CoV-2 by FDA under an Emergency Use Authorization (EUA). This EUA will remain  in effect (meaning this test can be used) for the duration of the COVID-19 declaration under Se ction 564(b)(1) of the Act, 21 U.S.C. section 360bbb-3(b)(1), unless the authorization is terminated or revoked sooner.  Performed at [facility], [place] [address] [place], [place] [address]      Scheduled Meds:  furosemide  20 mg Oral Daily   lactulose  30 g Oral BID   rifaximin  550 mg Oral BID   spironolactone  50 mg Oral Daily   Continuous Infusions:  cefTRIAXone (ROCEPHIN)  IV 2 g (06/29/20 1138)    Procedures/Studies: DG Chest 2 View  Result Date: 06/26/2020 CLINICAL DATA:  Cough.  Peritonitis EXAM: CHEST - 2 VIEW COMPARISON:  April 25, 2020 FINDINGS: There is mild scarring in the right mid lung and left base. There is interstitial thickening bilaterally, stable. No edema or airspace consolidation. Heart size and pulmonary vascularity are  normal. No adenopathy. There is degenerative change in the thoracic spine. IMPRESSION:  Scattered areas of scarring. Interstitial thickening, likely due to chronic bronchitis. No edema or airspace opacity. Stable cardiac silhouette. Electronically Signed   By: Lowella Grip III M.D.   On: 06/26/2020 18:54   US Paracentesis  Result Date: 06/25/2020 INDICATION: Cirrhosis Recurrent ascites EXAM: ULTRASOUND GUIDED RLQ PARACENTESIS MEDICATIONS: 10 cc 1% lidocaine COMPLICATIONS: None immediate. PROCEDURE: Informed written consent was obtained from the patient after a discussion of the risks, benefits and alternatives to treatment. A timeout was performed prior to the initiation of the procedure. Initial ultrasound scanning demonstrates a large amount of ascites within the right lower abdominal quadrant. The right lower abdomen was prepped and draped in the usual sterile fashion. 1% lidocaine was used for local anesthesia. Following this, a Yueh catheter was introduced. An ultrasound image was saved for documentation purposes. The paracentesis was performed. The catheter was removed and a dressing was applied. The patient tolerated the procedure well without immediate post procedural complication. Patient received post-procedure intravenous albumin; see nursing notes for details. FINDINGS: A total of approximately 3.3 liters of amber fluid was removed. Samples were sent to the laboratory as requested by the clinical team. IMPRESSION: Successful ultrasound-guided paracentesis yielding 3.3 liters of peritoneal fluid. Read by Lavonia Drafts [facility] Electronically Signed   By: Lavonia Dana M.D.   On: 06/25/2020 10:58    Orson Eva, DO  [facility]  If 7PM-7AM, please contact night-coverage [email or web address] Password [email or web address] 06/29/2020, 5:19 PM   LOS: 2 days

## 2020-06-29 NOTE — Telephone Encounter (Signed)
Just sent you a quick text on your cell regarding this pt. [name] would like to speak with you.

## 2020-06-29 NOTE — Progress Notes (Signed)
Patient with very flat affect most of shift. His vitals have been stable. Has tolerated PO intake.No complaints of pain. Bed in lowest position, call bell within reach. Wheels locked. Will continue to monitor.

## 2020-06-29 NOTE — Progress Notes (Signed)
Subjective: Feeling well. No abdominal pain, nausea, or vomiting. Again states he is wanting to go home if possible as he can't sleep here. Had 1 BM yesterday. No BM today. Wants to keep with BID dosing of lactulose. This helps with compliance. No confusion. No brbpr or melena.  No prior colonoscopy.  Reports recently completing Cologuard that was negative.  Objective: Vital signs in last 24 hours: Temp:  [98.1 F (36.7 C)-98.4 F (36.9 C)] 98.1 F (36.7 C) (06/17 0500) Pulse Rate:  [68-75] 75 (06/17 0500) Resp:  [18-20] 20 (06/17 0500) BP: (103-110)/(63-66) 106/66 (06/17 0500) SpO2:  [96 %-99 %] 96 % (06/16 2041) Weight:  [98 kg] 98 kg (06/17 0500) Last BM Date: 06/27/20 General:   Alert and oriented, pleasant Head:  Normocephalic and atraumatic. Eyes:  No icterus, sclera clear. Conjuctiva pink.  Abdomen:  Bowel sounds present. Full/mild distension, but soft and non-tender. No rebound or guarding. Soft umbilical hernia present that is non-tender.  Extremities:  With 2+ bilateral LE pitting edema up to knees. Neurologic:  Alert and  oriented x4;  grossly normal neurologically. Psych:  Normal mood and affect.  Intake/Output from previous day: 06/16 0701 - 06/17 0700 In: 1151.9 [P.O.:596; IV Piggyback:555.9] Out: -  Intake/Output this shift: Total I/O In: 236 [P.O.:236] Out: 350 [Urine:350]  Lab Results: Recent Labs    06/27/20 0425 06/28/20 0535 06/29/20 0616  WBC 9.2 12.4* 11.3*  HGB 11.2* 11.1* 9.9*  HCT 35.5* 32.4* 28.7*  PLT 94* 99* 83*   BMET Recent Labs    06/27/20 0425 06/28/20 0535 06/29/20 0616  NA 131* 130* 129*  K 3.9 3.5 3.5  CL 97* 98 98  CO2 26 26 26   GLUCOSE 87 89 105*  BUN 30* 22 18  CREATININE 1.47* 1.09 0.91  CALCIUM 8.3* 8.1* 8.1*   LFT Recent Labs    06/27/20 0425 06/28/20 0535 06/29/20 0616  PROT 5.2* 5.3* 5.2*  ALBUMIN 2.6* 2.5* 3.0*  AST 52* 57* 47*  ALT 24 26 22   ALKPHOS 159* 153* 130*  BILITOT 6.0* 7.9* 7.3*    PT/INR Recent Labs    06/28/20 0535 06/29/20 0616  LABPROT 21.0* 24.5*  INR 1.8* 2.2*    Assessment: 70 year old male with history of suspected alcoholic cirrhosis, abstinent since November 8101, complicated by hepatic encephalopathy, on Xifaxan and lactulose, and ascites on Lasix/Aldactone/requiring paras, now admitted with SBP with PMNs of 573 on paracentesis completed June 13.  Also with AKI, hyponatremia, increased bilirubin, elevated INR.  MELD up to 29 on admission from 17, 2 months ago.  SBP: Paracentesis completed June 13 yielding 3.3 L.  PMNs 573.  Ascitic culture and blood cultures no growth so far. Gram stain with no organisms.  Creatinine was up to 2.8, 1.03 a couple months ago.  Creatinine has now returned to normal.  Diuretics were held temporarily but restarted at 50% this admission.  On IV ceftriaxone 2 g daily.  Plans to complete 5-day course of antibiotics.  He will require SBP prophylaxis indefinitely, Cipro 500 mg daily.  He also received IV albumin on day 1 and IV albumin on day 3.  PPI was switched to H2 blocker due to risk of SBP with PPI.  White count was 13.7 on admission >> 14.4>> 9.2>> 12.4>> 11.3. Remains afebrile. Clinically doing well and asking to go home if possible. Recommend staying to complete IV antibiotics which will be completed tomorrow.   Cirrhosis: Decompensated with increase in MELD to 29.  MELD  27 today.  INR bumped up to 2.2 today from 1.8 yesterday. Will give 10 mg IV vitamin K today. T bili slightly improved to 7.3 from 7.9 yesterday.  LFTs stable. At this time no overt encephalopathy, questionable encephalopathy prior to admission. On lactulose 19mL BID with 1 BM daily. Ammonia down to 53 yesterday. Will increase lactulose to 45 mL BID as patient has requested to keep BID dosing. He has had some increased peripheral edema over the past several weeks.  Diuretics held on admission, resumed at low-dose, furosemide 20 mg daily, spironolactone 50 mg daily  with peripheral edema stable.  EGD currently up-to-date with no varices. Overdue for [place]. Considering bump in bili this admission, will go ahead and update [place]. Notably, AFP was normal this admission.   Anemia: Hemoglobin 12.0 on admission.  Down to 9.9 this morning.  No overt GI bleeding. 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 No prior colonoscopy.  Reports recently completing Cologuard that was negative.  Bilirubin is elevated this admission. Will fractionate. Doubt hemolysis. Suspect decline secondary to hemodilution in setting of IV albumin.    Plan: Complete 5-day course of antibiotics. At time of discharge, start Cipro 500 mg daily indefinitely for SBP prophylaxis. RUQ [place] for [facility] screening.  Fractionate bilirubin.  IV vitamin K today.  Increase lactulose to 45 mL twice daily.  Titrate to 2-3 soft stools daily. Continue Xifaxan 550 mg twice daily 2 g sodium restricted diet. Treat GERD with H2 blockers or over-the-counter antacids.  Avoid PPI therapy due to SBP risk. Continue to follow LFT, INR daily   LOS: 2 days    06/29/2020, 9:59 AM   Aliene Altes, [facility]enter[facility]

## 2020-06-30 DIAGNOSIS — R17 Unspecified jaundice: Secondary | ICD-10-CM

## 2020-06-30 DIAGNOSIS — D649 Anemia, unspecified: Secondary | ICD-10-CM

## 2020-06-30 DIAGNOSIS — K7031 Alcoholic cirrhosis of liver with ascites: Secondary | ICD-10-CM

## 2020-06-30 DIAGNOSIS — K652 Spontaneous bacterial peritonitis: Secondary | ICD-10-CM

## 2020-06-30 LAB — COMPREHENSIVE METABOLIC PANEL
ALT: 20 U/L (ref 0–44)
AST: 49 U/L — ABNORMAL HIGH (ref 15–41)
Albumin: 2.8 g/dL — ABNORMAL LOW (ref 3.5–5.0)
Alkaline Phosphatase: 133 U/L — ABNORMAL HIGH (ref 38–126)
Anion gap: 5 (ref 5–15)
BUN: 16 mg/dL (ref 8–23)
CO2: 25 mmol/L (ref 22–32)
Calcium: 8.1 mg/dL — ABNORMAL LOW (ref 8.9–10.3)
Chloride: 100 mmol/L (ref 98–111)
Creatinine, Ser: 0.69 mg/dL (ref 0.61–1.24)
GFR, Estimated: >60 mL/min (ref >60)
Glucose, Bld: 89 mg/dL (ref 70–99)
Potassium: 3.5 mmol/L (ref 3.5–5.1)
Sodium: 130 mmol/L — ABNORMAL LOW (ref 135–145)
Total Bilirubin: 7.9 mg/dL — ABNORMAL HIGH (ref 0.3–1.2)
Total Protein: 5.1 g/dL — ABNORMAL LOW (ref 6.5–8.1)

## 2020-06-30 LAB — CULTURE, BODY FLUID W GRAM STAIN -BOTTLE: Culture: NO GROWTH

## 2020-06-30 LAB — CBC
HCT: 30.5 % — ABNORMAL LOW (ref 39.0–52.0)
Hemoglobin: 10.2 g/dL — ABNORMAL LOW (ref 13.0–17.0)
MCH: 34.2 pg — ABNORMAL HIGH (ref 26.0–34.0)
MCHC: 33.4 g/dL (ref 30.0–36.0)
MCV: 102.3 fL — ABNORMAL HIGH (ref 80.0–100.0)
Platelets: 86 10*3/uL — ABNORMAL LOW (ref 150–400)
RBC: 2.98 MIL/uL — ABNORMAL LOW (ref 4.22–5.81)
RDW: 14 % (ref 11.5–15.5)
WBC: 12.8 10*3/uL — ABNORMAL HIGH (ref 4.0–10.5)
nRBC: 0 % (ref 0.0–0.2)

## 2020-06-30 LAB — PROTIME-INR
INR: 2 — ABNORMAL HIGH (ref 0.8–1.2)
Prothrombin Time: 22.3 seconds — ABNORMAL HIGH (ref 11.4–15.2)

## 2020-06-30 MED ORDER — CIPROFLOXACIN HCL 500 MG PO TABS: 500.0000 mg | ORAL_TABLET | Freq: Every day | ORAL | 1 refills | Status: DC

## 2020-06-30 MED ORDER — LACTULOSE 10 GM/15ML PO SOLN: 30.0000 g | Freq: Two times a day (BID) | ORAL | 1 refills | Status: DC

## 2020-06-30 MED ORDER — CIPROFLOXACIN HCL 250 MG PO TABS: 500.0000 mg | ORAL_TABLET | Freq: Every day | ORAL | Status: DC

## 2020-06-30 NOTE — Discharge Summary (Signed)
Physician Discharge Summary  Marcus Pham OFB:[id number] DOB: 1950-08-12 DOA: 06/26/2020  PCP: [name], MD  Admit date: 06/26/2020 Discharge date: 06/30/2020  Admitted From: Home Disposition:  Home  Recommendations for Outpatient Follow-up:  Follow up with PCP in 1-2 weeks Please obtain BMP/CBC in one week     Discharge Condition: Stable CODE STATUS:FULL Diet recommendation: Low sodium   Brief/Interim Summary: 70 y.o. male with medical history significant of chronic kidney disease a stage IIIb, gastroesophageal flux disease, hyperlipidemia, hypertension, history of depression/anxiety and alcoholic liver cirrhosis; who presented to the hospital secondary to abnormal findings after recent outpatient paracentesis suggesting the presence of SBP.  Patient with elevated WBCs, worsening renal function and ascitic fluid demonstrating 843 WBC.  Patient denies fevers, chills, headache, chest pain, dyspnea, nausea, vomiting, diarrhea, abdominal pain, dysuria, hematuria, hematochezia, and melena. Patient states he does not take his lactulose as directed b/c of bothersome diarrhea.  He is compliant with all his other meds.      ED Course: Blood work in ED demonstrating WBCs of 14.4, stable hemoglobin level at 13.0 and platelets count of 117; comprehensive metabolic panel with a stable LFTs, renal function with creatinine of 1.87 and BUN of 34.  Patient with hyponatremia sodium 126 and chloride 94.  Case discussed with GI service who recommended admission for treatment of SBP and to provide IV albumin.  Patient is not meeting sepsis criteria at this time.  Discharge Diagnoses:  SBP (spontaneous bacterial peritonitis) -Elevated WBCs, but no nausea, no vomiting, no abdominal pain, no fever. -culture neg -Ascitic fluid results on 06/25/20 with 843 WBC (68% PMNs) -GI following -diuretics held 6/14 due to AKI -6/15--lasix/spiro restarted at half dose>>back to home dose at time of  d/c -Patient has been encourage to follow low-sodium diet. -finished 5 days ceftriaxone -d/c home with cipro for SBP prophylaxis   Hepatic encephalopathy -ammonia = 73>>53 -daughter endorsed that pt's responses slower x 1 week -restarted lactulose 6/14 -continue rifaximin -increased lactulose from 20 g bid to 30 g bid   AKI -baseline creatinine 1.0-1.1 -serum creatinine peaked 2.18 -held lasix/spiro on 6/14 -06/27/20 lasix/spiro half dose restarted-->renal function stable -d/c home on prior home dose of lasix and spiro   Essential hypertension -Stable overall    Decompensated Hepatic cirrhosis ([place]) -recommendation by gastroenterology service. -LFTs are stable. -Patient reports last drink was in November 2021.   thrombocytopenia -In the setting of liver cirrhosis -No overt bleeding appreciated -Holding heparin products and closely following platelets count. -SCDs for DVT prophylaxis.     Discharge Instructions   Allergies as of 06/30/2020   No Known Allergies      Medication List     STOP taking these medications    ibuprofen 800 MG tablet Commonly known as: ADVIL       TAKE these medications    ciprofloxacin 500 MG tablet Commonly known as: CIPRO Take 1 tablet (500 mg total) by mouth daily with breakfast. Start taking on: July 01, 2020   cyclobenzaprine 10 MG tablet Commonly known as: FLEXERIL Take 10 mg by mouth at bedtime as needed for muscle spasms.   furosemide 40 MG tablet Commonly known as: Lasix Take 1 tablet (40 mg total) by mouth daily.   lactulose 10 GM/15ML solution Commonly known as: CHRONULAC Take 45 mLs (30 g total) by mouth 2 (two) times daily. What changed:  how much to take when to take this additional instructions   omeprazole 20 MG capsule Commonly known as: PRILOSEC  Take 20 mg by mouth daily as needed (acid reflux).   ondansetron 4 MG tablet Commonly known as: ZOFRAN Take 4 mg by mouth every 8 (eight) hours as  needed for nausea or vomiting.   rifaximin 550 MG Tabs tablet Commonly known as: XIFAXAN Take 1 tablet (550 mg total) by mouth 2 (two) times daily.   spironolactone 100 MG tablet Commonly known as: Aldactone Take 1 tablet (100 mg total) by mouth daily.        No Known Allergies  Consultations: GI   Procedures/Studies: DG Chest 2 View  Result Date: 06/26/2020 CLINICAL DATA:  Cough.  Peritonitis EXAM: CHEST - 2 VIEW COMPARISON:  April 25, 2020 FINDINGS: There is mild scarring in the right mid lung and left base. There is interstitial thickening bilaterally, stable. No edema or airspace consolidation. Heart size and pulmonary vascularity are normal. No adenopathy. There is degenerative change in the thoracic spine. IMPRESSION: Scattered areas of scarring. Interstitial thickening, likely due to chronic bronchitis. No edema or airspace opacity. Stable cardiac silhouette. Electronically Signed   By: Lowella Grip III M.D.   On: 06/26/2020 18:54   US Paracentesis  Result Date: 06/25/2020 INDICATION: Cirrhosis Recurrent ascites EXAM: ULTRASOUND GUIDED RLQ PARACENTESIS MEDICATIONS: 10 cc 1% lidocaine COMPLICATIONS: None immediate. PROCEDURE: Informed written consent was obtained from the patient after a discussion of the risks, benefits and alternatives to treatment. A timeout was performed prior to the initiation of the procedure. Initial ultrasound scanning demonstrates a large amount of ascites within the right lower abdominal quadrant. The right lower abdomen was prepped and draped in the usual sterile fashion. 1% lidocaine was used for local anesthesia. Following this, a Yueh catheter was introduced. An ultrasound image was saved for documentation purposes. The paracentesis was performed. The catheter was removed and a dressing was applied. The patient tolerated the procedure well without immediate post procedural complication. Patient received post-procedure intravenous albumin; see  nursing notes for details. FINDINGS: A total of approximately 3.3 liters of amber fluid was removed. Samples were sent to the laboratory as requested by the clinical team. IMPRESSION: Successful ultrasound-guided paracentesis yielding 3.3 liters of peritoneal fluid. Read by Lavonia Drafts [facility] Electronically Signed   By: Lavonia Dana M.D.   On: 06/25/2020 10:58   US Abdomen Limited RUQ (LIVER/GB)  Result Date: 06/29/2020 CLINICAL DATA:  Cirrhosis, elevated bilirubin, history ascites EXAM: ULTRASOUND ABDOMEN LIMITED RIGHT UPPER QUADRANT COMPARISON:  12/06/2019 FINDINGS: Gallbladder: Gallbladder distended by shadowing calculi and sludge. Phrygian cap noted. Gallbladder wall appears thickened, nonspecific in the setting of ascites. No sonographic Murphy sign. Common bile duct: Diameter: 3 mm, normal Liver: Nodular cirrhotic liver with heterogeneous increased parenchymal echogenicity. No focal mass or intrahepatic biliary dilatation. Portal vein is patent on color Doppler imaging with normal direction of blood flow towards the liver. Other: Scattered ascites IMPRESSION: Calculi and sludge descent gallbladder. Cirrhotic liver without mass or biliary dilatation. Scattered ascites. Electronically Signed   By: Lavonia Dana M.D.   On: 06/29/2020 17:18        Discharge Exam: Vitals:   06/30/20 0544 06/30/20 0820  BP: (!) 117/59 (!) 110/59  Pulse: 68 66  Resp: 20 19  Temp: 98.1 F (36.7 C) 97.9 F (36.6 C)  SpO2: 97% 98%   Vitals:   06/29/20 0500 06/29/20 2107 06/30/20 0544 06/30/20 0820  BP: 106/66 107/62 (!) 117/59 (!) 110/59  Pulse: 75 67 68 66  Resp: 20 20 20 19   Temp: 98.1 F (36.7 C) 98.2  F (36.8 C) 98.1 F (36.7 C) 97.9 F (36.6 C)  TempSrc:  Oral Oral Oral  SpO2:  97% 97% 98%  Weight: 98 kg     Height:        General: Pt is alert, awake, not in acute distress Cardiovascular: RRR, S1/S2 +, no rubs, no gallops Respiratory: bibasilar crackles. No wheeze Abdominal: Soft, NT, ND, bowel  sounds + Extremities: no edema, no cyanosis   The results of significant diagnostics from this hospitalization (including imaging, microbiology, ancillary and laboratory) are listed below for reference.    Significant Diagnostic Studies: DG Chest 2 View  Result Date: 06/26/2020 CLINICAL DATA:  Cough.  Peritonitis EXAM: CHEST - 2 VIEW COMPARISON:  April 25, 2020 FINDINGS: There is mild scarring in the right mid lung and left base. There is interstitial thickening bilaterally, stable. No edema or airspace consolidation. Heart size and pulmonary vascularity are normal. No adenopathy. There is degenerative change in the thoracic spine. IMPRESSION: Scattered areas of scarring. Interstitial thickening, likely due to chronic bronchitis. No edema or airspace opacity. Stable cardiac silhouette. Electronically Signed   By: Lowella Grip III M.D.   On: 06/26/2020 18:54   US Paracentesis  Result Date: 06/25/2020 INDICATION: Cirrhosis Recurrent ascites EXAM: ULTRASOUND GUIDED RLQ PARACENTESIS MEDICATIONS: 10 cc 1% lidocaine COMPLICATIONS: None immediate. PROCEDURE: Informed written consent was obtained from the patient after a discussion of the risks, benefits and alternatives to treatment. A timeout was performed prior to the initiation of the procedure. Initial ultrasound scanning demonstrates a large amount of ascites within the right lower abdominal quadrant. The right lower abdomen was prepped and draped in the usual sterile fashion. 1% lidocaine was used for local anesthesia. Following this, a Yueh catheter was introduced. An ultrasound image was saved for documentation purposes. The paracentesis was performed. The catheter was removed and a dressing was applied. The patient tolerated the procedure well without immediate post procedural complication. Patient received post-procedure intravenous albumin; see nursing notes for details. FINDINGS: A total of approximately 3.3 liters of amber fluid was removed.  Samples were sent to the laboratory as requested by the clinical team. IMPRESSION: Successful ultrasound-guided paracentesis yielding 3.3 liters of peritoneal fluid. Read by Lavonia Drafts [facility] Electronically Signed   By: Lavonia Dana M.D.   On: 06/25/2020 10:58   US Abdomen Limited RUQ (LIVER/GB)  Result Date: 06/29/2020 CLINICAL DATA:  Cirrhosis, elevated bilirubin, history ascites EXAM: ULTRASOUND ABDOMEN LIMITED RIGHT UPPER QUADRANT COMPARISON:  12/06/2019 FINDINGS: Gallbladder: Gallbladder distended by shadowing calculi and sludge. Phrygian cap noted. Gallbladder wall appears thickened, nonspecific in the setting of ascites. No sonographic Murphy sign. Common bile duct: Diameter: 3 mm, normal Liver: Nodular cirrhotic liver with heterogeneous increased parenchymal echogenicity. No focal mass or intrahepatic biliary dilatation. Portal vein is patent on color Doppler imaging with normal direction of blood flow towards the liver. Other: Scattered ascites IMPRESSION: Calculi and sludge descent gallbladder. Cirrhotic liver without mass or biliary dilatation. Scattered ascites. Electronically Signed   By: Lavonia Dana M.D.   On: 06/29/2020 17:18    Microbiology: Recent Results (from the past 240 hour(s))  Culture, body fluid w Gram Stain-bottle     Status: None   Collection Time: 06/25/20 10:54 AM   Specimen: Ascitic  Result Value Ref Range Status   Specimen Description ASCITIC  Final   Special Requests 10CC  Final   Culture   Final    NO GROWTH 5 DAYS Performed at [facility], [address], [place],  [place] [address]    Report Status 06/30/2020 FINAL  Final  Gram stain     Status: None   Collection Time: 06/25/20 10:54 AM   Specimen: Ascitic  Result Value Ref Range Status   Specimen Description ASCITIC  Final   Special Requests NONE  Final   Gram Stain   Final    NO ORGANISMS SEEN WBC PRESENT, PREDOMINANTLY PMN Performed at [facility], [address], [place], [place] [address]     Report Status 06/25/2020 FINAL  Final  Blood culture (routine x 2)     Status: None (Preliminary result)   Collection Time: 06/26/20 11:07 AM   Specimen: BLOOD  Result Value Ref Range Status   Specimen Description BLOOD BLOOD RIGHT FOREARM  Final   Special Requests   Final    Immunocompromised Blood Culture results may not be optimal due to an inadequate volume of blood received in culture bottles BOTTLES DRAWN AEROBIC AND ANAEROBIC   Culture   Final    NO GROWTH 4 DAYS Performed at [facility], [address], [place], [place] [address]    Report Status PENDING  Incomplete  Blood culture (routine x 2)     Status: None (Preliminary result)   Collection Time: 06/26/20  1:18 PM   Specimen: BLOOD  Result Value Ref Range Status   Specimen Description BLOOD  Final   Special Requests Immunocompromised  Final   Culture   Final    NO GROWTH 4 DAYS Performed at [facility], [address], [place], [place] [address]    Report Status PENDING  Incomplete  SARS CORONAVIRUS 2 (Verle Brillhart 6-24 HRS) Nasopharyngeal Nasopharyngeal Swab     Status: None   Collection Time: 06/26/20  1:51 PM   Specimen: Nasopharyngeal Swab  Result Value Ref Range Status   SARS Coronavirus 2 NEGATIVE NEGATIVE Final    Comment: (NOTE) SARS-CoV-2 target nucleic acids are NOT DETECTED.  The SARS-CoV-2 RNA is generally detectable in upper and lower respiratory specimens during the acute phase of infection. Negative results do not preclude SARS-CoV-2 infection, do not rule out co-infections with other pathogens, and should not be used as the sole basis for treatment or other patient management decisions. Negative results must be combined with clinical observations, patient history, and epidemiological information. The expected result is Negative.  Fact Sheet for Patients: SugarRoll.be  Fact Sheet for Healthcare Providers: https://www.woods-mathews.com/  This test is not yet  approved or cleared by the [place] FDA and  has been authorized for detection and/or diagnosis of SARS-CoV-2 by FDA under an Emergency Use Authorization (EUA). This EUA will remain  in effect (meaning this test can be used) for the duration of the COVID-19 declaration under Se ction 564(b)(1) of the Act, 21 U.S.C. section 360bbb-3(b)(1), unless the authorization is terminated or revoked sooner.  Performed at [facility], [place] [address] [place], [place] [address]      Labs: Basic Metabolic Panel: Recent Labs  Lab 06/26/20 1102 06/26/20 1758 06/27/20 0425 06/28/20 0535 06/29/20 0616 06/30/20 0544  NA 126*  --  131* 130* 129* 130*  K 3.7  --  3.9 3.5 3.5 3.5  CL 94*  --  97* 98 98 100  CO2 23  --  26 26 26 25   GLUCOSE 132*  --  87 89 105* 89  BUN 34*  --  30* 22 18 16   CREATININE 1.87*  --  1.47* 1.09 0.91 0.69  CALCIUM 8.1*  --  8.3* 8.1*  8.1* 8.1*  MG  --  2.2  --   --   --   --   PHOS  --  3.8  --   --   --   --    Liver Function Tests: Recent Labs  Lab 06/26/20 1102 06/27/20 0425 06/28/20 0535 06/29/20 0616 06/30/20 0544  AST 77* 52* 57* 47* 49*  ALT 34 24 26 22 20   ALKPHOS 226* 159* 153* 130* 133*  BILITOT 6.8* 6.0* 7.9* 7.3* 7.9*  PROT 5.7* 5.2* 5.3* 5.2* 5.1*  ALBUMIN 2.1* 2.6* 2.5* 3.0* 2.8*   No results for input(s): LIPASE, AMYLASE in the last 168 hours. Recent Labs  Lab 06/25/20 1112 06/28/20 0535  AMMONIA 73* 53*   CBC: Recent Labs  Lab 06/26/20 1102 06/27/20 0425 06/28/20 0535 06/29/20 0616 06/30/20 0544  WBC 14.4* 9.2 12.4* 11.3* 12.8*  HGB 13.0 11.2* 11.1* 9.9* 10.2*  HCT 39.3 35.5* 32.4* 28.7* 30.5*  MCV 104.2* 109.6* 101.3* 101.8* 102.3*  PLT 117* 94* 99* 83* 86*   Cardiac Enzymes: No results for input(s): CKTOTAL, CKMB, CKMBINDEX, TROPONINI in the last 168 hours. BNP: Invalid input(s): POCBNP CBG: No results for input(s): GLUCAP in the last 168 hours.  Time coordinating discharge:  36 minutes  Signed:  Orson Eva, DO [facility] Pager: [phone] 06/30/2020, 12:06 PM

## 2020-06-30 NOTE — Progress Notes (Addendum)
Subjective:  Patient has no complaints.  He states he ate most of his breakfast.  He denies nausea vomiting or abdominal pain.  He is passing flatus but has not had a bowel movement today.   Current Medications: Current Facility-Administered Medications  Medication Dose Route Frequency Provider Last Rate Last Admin   acetaminophen (TYLENOL) tablet 650 mg  650 mg Oral Q6H PRN Barton Dubois, MD       Or   acetaminophen (TYLENOL) suppository 650 mg  650 mg Rectal Q6H PRN Barton Dubois, MD       alum & mag hydroxide-simeth (MAALOX/MYLANTA) 200-200-20 MG/5ML suspension 30 mL  30 mL Oral Q4H PRN Barton Dubois, MD   30 mL at 06/27/20 2246   cefTRIAXone (ROCEPHIN) 2 g in sodium chloride 0.9 % 100 mL IVPB  2 g Intravenous Q24H Barton Dubois, MD 200 mL/hr at 06/29/20 1138 2 g at 06/29/20 1138   famotidine (PEPCID) tablet 20 mg  20 mg Oral BID PRN Mahala Menghini, PA-C       furosemide (LASIX) tablet 20 mg  20 mg Oral Daily Barton Dubois, MD   20 mg at 06/30/20 1024   guaiFENesin-dextromethorphan (ROBITUSSIN DM) 100-10 MG/5ML syrup 15 mL  15 mL Oral Q8H PRN Barton Dubois, MD       ipratropium-albuterol (DUONEB) 0.5-2.5 (3) MG/3ML nebulizer solution 3 mL  3 mL Nebulization Q6H PRN Barton Dubois, MD       lactulose (CHRONULAC) 10 GM/15ML solution 30 g  30 g Oral BID Jodi Mourning, Kristen S, PA-C   30 g at 06/30/20 1024   ondansetron (ZOFRAN) tablet 4 mg  4 mg Oral Q6H PRN Barton Dubois, MD       Or   ondansetron [facility]) injection 4 mg  4 mg Intravenous Q6H PRN Barton Dubois, MD       rifaximin Doreene Nest) tablet 550 mg  550 mg Oral BID Barton Dubois, MD   550 mg at 06/30/20 1024   spironolactone (ALDACTONE) tablet 50 mg  50 mg Oral Daily Barton Dubois, MD   50 mg at 06/30/20 1024     Objective: Blood pressure (!) 110/59, pulse 66, temperature 97.9 F (36.6 C), temperature source Oral, resp. rate 19, height 6' 1"  (1.854 m), weight 98 kg, SpO2 98 %. Patient is alert and he does not have  asterixis. Conjunctiva is pink. Sclera is icteric Oropharyngeal mucosa is normal. No neck masses or thyromegaly noted. Cardiac exam with regular rhythm normal S1 and S2.  Faint systolic murmur noted at aortic area. Lungs are clear to auscultation. Abdomen is distended.  He has umbilical hernia which is reducible and not tender.  It immediately recurs when pressure is released.  On palpation abdomen is nontender.  Bulging flanks. 1+ pitting edema involving both legs.  Labs/studies Results:   CBC Latest Ref Rng & Units 06/30/2020 06/29/2020 06/28/2020  WBC 4.0 - 10.5 K/uL 12.8(H) 11.3(H) 12.4(H)  Hemoglobin 13.0 - 17.0 g/dL 10.2(L) 9.9(L) 11.1(L)  Hematocrit 39.0 - 52.0 % 30.5(L) 28.7(L) 32.4(L)  Platelets 150 - 400 K/uL 86(L) 83(L) 99(L)    CMP Latest Ref Rng & Units 06/30/2020 06/29/2020 06/28/2020  Glucose 70 - 99 mg/dL 89 105(H) 89  BUN 8 - 23 mg/dL 16 18 22   Creatinine 0.61 - 1.24 mg/dL 0.69 0.91 1.09  Sodium 135 - 145 mmol/L 130(L) 129(L) 130(L)  Potassium 3.5 - 5.1 mmol/L 3.5 3.5 3.5  Chloride 98 - 111 mmol/L 100 98 98  CO2 22 - 32 mmol/L 25 26  26  Calcium 8.9 - 10.3 mg/dL 8.1(L) 8.1(L) 8.1(L)  Total Protein 6.5 - 8.1 g/dL 5.1(L) 5.2(L) 5.3(L)  Total Bilirubin 0.3 - 1.2 mg/dL 7.9(H) 7.3(H) 7.9(H)  Alkaline Phos 38 - 126 U/L 133(H) 130(H) 153(H)  AST 15 - 41 U/L 49(H) 47(H) 57(H)  ALT 0 - 44 U/L 20 22 26     Hepatic Function Latest Ref Rng & Units 06/30/2020 06/29/2020 06/28/2020  Total Protein 6.5 - 8.1 g/dL 5.1(L) 5.2(L) 5.3(L)  Albumin 3.5 - 5.0 g/dL 2.8(L) 3.0(L) 2.5(L)  AST 15 - 41 U/L 49(H) 47(H) 57(H)  ALT 0 - 44 U/L 20 22 26   Alk Phosphatase 38 - 126 U/L 133(H) 130(H) 153(H)  Total Bilirubin 0.3 - 1.2 mg/dL 7.9(H) 7.3(H) 7.9(H)   INR 2.0.  Meld score is 22.047  Ultrasound revealed cirrhotic liver ascites and cholelithiasis.  Assessment:  #1.  Spontaneous bacterial peritonitis/culture-negative neutrophilic ascites.  Day 5 1 IV ceftriaxone.  #2.  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  His disease has been complicated by ascites hepatic encephalopathy thrombocytopenia.  He underwent abdominal paracenteses on admission.  Ascites is not tense.  Therefore no indication for LVAP. Meld score remains high but trending downwards.  It was 29 on admission  #3.  Cholestasis.  Patient's bilirubin was 3.2 two months ago.  It was 6.8 on admission and peaked to 7.92 days ago and it is 7.9 today.  No evidence of biliary obstruction.  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  #4.  Acute kidney injury.  Renal function has returned to normal.  #5.  Anemia appears to be due to chronic disease.  Recommendations  Patient advised not to take ibuprofen or other NSAIDs.  I saw that it is in list of his medications and he has been using it on an intermittent basis. He can take acetaminophen up to a gram daily on as-needed basis. Begin Cipro 500 mg by mouth daily at the time of discharge. Will arrange for patient to be seen at [facility] week after next.  He will have lab studies prior to that visit.

## 2020-07-01 LAB — CULTURE, BLOOD (ROUTINE X 2)
Culture: NO GROWTH
Culture: NO GROWTH

## 2020-07-02 NOTE — Telephone Encounter (Signed)
noted 

## 2020-07-02 NOTE — Telephone Encounter (Signed)
I called and spoke with patient's daughter [name] on 06/29/2020.  I updated her on patient's current clinical status as well as long-term prognosis.  All questions and concerns have been answered.  Recommended she keep follow-up appointment for her father with [place] in a few weeks.

## 2020-07-05 ENCOUNTER — Telehealth: Payer: Self-pay | Admitting: Internal Medicine

## 2020-07-05 NOTE — Telephone Encounter (Signed)
LMTCB

## 2020-07-05 NOTE — Telephone Encounter (Signed)
[phone]  PATIENT HAS A FEW QUESTIONS ABOUT HIS UPCOMING APPOINTMENT

## 2020-07-05 NOTE — Telephone Encounter (Signed)
Hey Dr. Abbey Chatters,  Pt's daughter Marcus Pham has some things she wants to discuss with you regarding her dad. 1.Sunday when she went to see him he was vomiting that 15 to 20 minutes she observed him and she notice it had spots of blood in it (not a lot). Also he had not taken his med's that day or the Lactulose. 2. She found out Monday afternoon the pt had fell down the stairs (some muscle soreness) 3. Wants to speak to you regarding the overall quality of life and resources for palliative care for the pt. 3. Pt has an appt with you July 6th and will there be any labs or test that need to be done prior to visit.

## 2020-07-10 ENCOUNTER — Other Ambulatory Visit: Payer: Self-pay

## 2020-07-10 ENCOUNTER — Telehealth: Payer: Self-pay

## 2020-07-10 DIAGNOSIS — K7031 Alcoholic cirrhosis of liver with ascites: Secondary | ICD-10-CM

## 2020-07-10 DIAGNOSIS — R112 Nausea with vomiting, unspecified: Secondary | ICD-10-CM

## 2020-07-10 MED ORDER — ONDANSETRON HCL 4 MG PO TABS: 4.0000 mg | ORAL_TABLET | Freq: Four times a day (QID) | ORAL | 0 refills | Status: AC

## 2020-07-10 NOTE — Telephone Encounter (Signed)
error 

## 2020-07-10 NOTE — Telephone Encounter (Signed)
Call from Dr. Abbey Chatters advising to put in order at [facility] for the pt to have a CMET and INR (Dx: cirrhosis). Also to send in Rx to the [place] for Ondansetron 4 mg. Sig: take 1 tab Q 6 hrs prn for nausea. Qty # 30 tablets.

## 2020-07-10 NOTE — Telephone Encounter (Signed)
Dr. Abbey Chatters  The pt's daughter phoned back this morning stating she needs to speak with you regarding her father. Please call regarding 07/05/2020 note.

## 2020-07-11 DIAGNOSIS — K7031 Alcoholic cirrhosis of liver with ascites: Secondary | ICD-10-CM | POA: Diagnosis not present

## 2020-07-12 LAB — COMPREHENSIVE METABOLIC PANEL
AG Ratio: 1.1 (calc) (ref 1.0–2.5)
ALT: 29 U/L (ref 9–46)
AST: 57 U/L — ABNORMAL HIGH (ref 10–35)
Albumin: 3 g/dL — ABNORMAL LOW (ref 3.6–5.1)
Alkaline phosphatase (APISO): 200 U/L — ABNORMAL HIGH (ref 35–144)
BUN: 12 mg/dL (ref 7–25)
CO2: 27 mmol/L (ref 20–32)
Calcium: 8.2 mg/dL — ABNORMAL LOW (ref 8.6–10.3)
Chloride: 98 mmol/L (ref 98–110)
Creat: 0.97 mg/dL (ref 0.70–1.18)
Globulin: 2.8 g/dL (calc) (ref 1.9–3.7)
Glucose, Bld: 140 mg/dL — ABNORMAL HIGH (ref 65–99)
Potassium: 4.3 mmol/L (ref 3.5–5.3)
Sodium: 131 mmol/L — ABNORMAL LOW (ref 135–146)
Total Bilirubin: 8.7 mg/dL — ABNORMAL HIGH (ref 0.2–1.2)
Total Protein: 5.8 g/dL — ABNORMAL LOW (ref 6.1–8.1)

## 2020-07-12 LAB — PROTIME-INR
INR: 1.6 — ABNORMAL HIGH
Prothrombin Time: 15.8 s — ABNORMAL HIGH (ref 9.0–11.5)

## 2020-07-18 ENCOUNTER — Other Ambulatory Visit: Payer: Self-pay

## 2020-07-18 ENCOUNTER — Ambulatory Visit (INDEPENDENT_AMBULATORY_CARE_PROVIDER_SITE_OTHER): Payer: No Typology Code available for payment source | Admitting: Internal Medicine

## 2020-07-18 ENCOUNTER — Encounter: Payer: Self-pay | Admitting: Internal Medicine

## 2020-07-18 VITALS — BP 124/69 | HR 66 | Temp 97.1°F | Ht 73.0 in | Wt 202.6 lb

## 2020-07-18 DIAGNOSIS — K219 Gastro-esophageal reflux disease without esophagitis: Secondary | ICD-10-CM | POA: Diagnosis not present

## 2020-07-18 DIAGNOSIS — K652 Spontaneous bacterial peritonitis: Secondary | ICD-10-CM

## 2020-07-18 DIAGNOSIS — K7031 Alcoholic cirrhosis of liver with ascites: Secondary | ICD-10-CM | POA: Diagnosis not present

## 2020-07-18 NOTE — Progress Notes (Signed)
Referring Provider: [name], MD Primary Care Physician:  [name], MD Primary GI:  Dr. Abbey Chatters  Chief Complaint  Patient presents with   Follow-up    Lactulose working good    HPI:   Marcus Pham is a 70 y.o. male who presents for follow up visit.   Upon initial consultation in October 2021 for elevated LFTs, I ordered blood work to be performed including full serological work-up but patient was confused and did not have this done.  I ordered an ultrasound with elastography and patient did show up for this.  I received a call from radiology stating that patient had large amount of ascites and test could not be performed.  He subsequently underwent paracentesis with 4 L of ascitic fluid removed.  Fluid albumin was reported as less than 1, serum albumin 2.8 indicating a SAAG of greater than 1.1, fluid protein was also low indicating that patient's ascites is likely from portal hypertension.  Subsequent ultrasound showed cirrhotic appearing liver, elastography with significantly elevated median kPa of 37.8.   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  Serological work-up negative for other causes.Patient notes previous alcohol abuse drinking 6-8 beers at least daily for many years.  He stopped drinking early November 2021.     His daughter Marcus Pham and wife Marcus Pham accompany him today.     Patient has history of hepatic encephalopathy which is maintained on Xifaxan as well as lactulose.  Patient endorses not taking the lactulose every day.  Patient's daughter does note that since being on the Xifaxan he does seem to have more good days than bad days.  Appears more lucid.   For hypervolemia he is maintained on Lasix 40 mg and spironolactone 100 mg daily.  Denies any abdominal distention for me today.  States he is avoiding salt best he can.    Patient underwent EGD 01/10/2020 for variceal screening without evidence of esophageal varices.    Ultrasound 06/29/20 without hepatoma's.  Patient admitted to our facility 06/26/2020 after undergoing outpatient paracentesis for ascites found to have SBP based on PMN count as well as acute kidney injury.  Completed 5 days of IV antibiotics as well as albumin therapy in the hospital and his AKI resolved.  Currently maintained on daily ciprofloxacin for SBP prophylaxis.  Today states he is doing okay.  Had labs done a week ago with MELD of 24   Past Medical History:  Diagnosis Date   AKI (acute kidney injury) ([place])    Anemia    Anxiety    Arthritis    back and hands   Depression    PTSD   Encephalopathy, hepatic ([place]) 12/15/2019   GERD (gastroesophageal reflux disease)    Hyperlipidemia    Hypertension    Substance abuse ([place])    patient denies but says he drinks up to 12 beers a day   Thyroid disease     Past Surgical History:  Procedure Laterality Date   cyst     1970s-removal in [employer]    ESOPHAGOGASTRODUODENOSCOPY (EGD) WITH PROPOFOL N/A 01/10/2020   Procedure: ESOPHAGOGASTRODUODENOSCOPY (EGD) WITH PROPOFOL;  Surgeon: Eloise Harman, DO;  [facility];  Service: Endoscopy;  Laterality: N/A;  2:15pm   ROTATOR CUFF REPAIR Left    2002-2003?   TONSILLECTOMY     as a child    Current Outpatient Medications  Medication Sig Dispense Refill   ciprofloxacin (CIPRO) 500 MG tablet Take 1 tablet (500  mg total) by mouth daily with breakfast. 30 tablet 1   cyclobenzaprine (FLEXERIL) 10 MG tablet Take 10 mg by mouth at bedtime as needed for muscle spasms.     furosemide (LASIX) 40 MG tablet Take 1 tablet (40 mg total) by mouth daily. 90 tablet 3   lactulose (CHRONULAC) 10 GM/15ML solution Take 45 mLs (30 g total) by mouth 2 (two) times daily. 946 mL 1   omeprazole (PRILOSEC) 20 MG capsule Take 20 mg by mouth as needed (acid reflux).     ondansetron (ZOFRAN) 4 MG tablet Take 1 tablet (4 mg total) by mouth every 6 (six) hours for 30 doses. (Patient taking differently: Take  4 mg by mouth as needed.) 30 tablet 0   rifaximin (XIFAXAN) 550 MG TABS tablet Take 1 tablet (550 mg total) by mouth 2 (two) times daily. 180 tablet 3   spironolactone (ALDACTONE) 100 MG tablet Take 1 tablet (100 mg total) by mouth daily. 90 tablet 3   No current facility-administered medications for this visit.    Allergies as of 07/18/2020   (No Known Allergies)    Family History  Problem Relation Age of Onset   Hypertension Mother    Arthritis Mother    Heart disease Father 88       CHF   Alcohol abuse Brother        substance abuse, died of trauma   Heart disease Paternal Grandfather 69       MI    Social History   Socioeconomic History   Marital status: Married    Spouse name: Not on file   Number of children: Not on file   Years of education: Not on file   Highest education level: Not on file  Occupational History   Not on file  Tobacco Use   Smoking status: Former    Pack years: 0.00   Smokeless tobacco: Never   Tobacco comments:    quit 40 years ago   Vaping Use   Vaping Use: Never used  Substance and Sexual Activity   Alcohol use: Not Currently    Alcohol/week: 4.0 - 5.0 standard drinks    Types: 4 - 5 Cans of beer per week   Drug use: Not Currently    Types: Marijuana    Comment: occasional marijuana use   Sexual activity: Yes  Other Topics Concern   Not on file  Social History Narrative   Not on file   Social Determinants of Health   Financial Resource Strain: Not on file  Food Insecurity: Not on file  Transportation Needs: Not on file  Physical Activity: Not on file  Stress: Not on file  Social Connections: Not on file    Subjective: Review of Systems  Constitutional:  Positive for malaise/fatigue. Negative for chills and fever.  HENT:  Negative for congestion and hearing loss.   Eyes:  Negative for blurred vision and double vision.  Respiratory:  Negative for cough and shortness of breath.   Cardiovascular:  Positive for leg swelling.  Negative for chest pain and palpitations.  Gastrointestinal:  Negative for abdominal pain, blood in stool, constipation, diarrhea, heartburn, melena and vomiting.  Genitourinary:  Negative for dysuria and urgency.  Musculoskeletal:  Negative for joint pain and myalgias.  Skin:  Negative for itching and rash.  Neurological:  Negative for dizziness and headaches.  Psychiatric/Behavioral:  Negative for depression. The patient is not nervous/anxious.     Objective: BP 124/69   Pulse 66  Temp (!) 97.1 F (36.2 C) (Temporal)   Ht 6\' 1"  (1.854 m)   Wt 202 lb 9.6 oz (91.9 kg)   BMI 26.73 kg/m  Physical Exam Constitutional:      Appearance: Normal appearance.  HENT:     Head: Normocephalic and atraumatic.  Eyes:     General: Scleral icterus present.     Extraocular Movements: Extraocular movements intact.  Cardiovascular:     Rate and Rhythm: Normal rate and regular rhythm.  Pulmonary:     Effort: Pulmonary effort is normal.     Breath sounds: Normal breath sounds.  Abdominal:     General: Bowel sounds are normal.     Palpations: Abdomen is soft.  Musculoskeletal:        General: Normal range of motion.     Cervical back: Normal range of motion and neck supple.     Right lower leg: Edema present.     Left lower leg: Edema present.  Skin:    General: Skin is warm.     Coloration: Skin is jaundiced.  Neurological:     General: No focal deficit present.     Mental Status: He is alert and oriented to person, place, and time.  Psychiatric:        Mood and Affect: Mood normal.        Behavior: Behavior normal.     Assessment: *Decompensated cirrhosis-etiology likely alcohol *Portal hypertension with ascites *Hepatic encephalopathy *History of SBP   Plan: I spent a long amount of time discussing diagnosis, long-term prognosis of cirrhosis with patient, wife, and daughter today.  I also discussed complications associated and treatment strategies as well.   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  Serological work-up negative for other causes.   Most recent MELD 24   Patient appears euvolemic in office today.  Continue on Lasix 40 mg daily and Aldactone 100 mg daily.    Continue on ciprofloxacin daily for SBP prophylaxis.   I counseled patient and daughter on the vast importance of a low-sodium diet, less than 2000 mg daily.  Also counseled on ensuring that patient gets enough protein in his diet to reduce risk of muscle wasting.  Recommended a protein snack prior to bedtime each night.   Hepatic encephalopathy appears to be improved on Xifaxan.  We will continue.  Patient counseled to take lactulose as well especially if he is feeling more fatigued. Counseled patient on titrating up his lactulose to ensure at least 2-3 loose bowel movements daily.   EGD 01/10/2020 for variceal screening without evidence of esophageal varices.  Repeat 12/2021 or sooner if further decompensation.   Ultrasound 06/29/20 without hepatoma's.   Discussed long-term prognosis of decompensated cirrhosis with patient and daughter today.  He has elevated risk of mortality given decompensated nature of his liver disease and elevated MELD.  I believe at this stage, patient would qualify for hospice care.  He would like to hold off for now but is not averse to this in the future.  Also recommended that he discuss with his family end-of-life decisions including CODE STATUS should he be admitted to the hospital again.  Patient states he has a living will.  Follow-up in 6 to 8 weeks.   07/18/2020 1:28 PM   Disclaimer: This note was dictated with voice recognition software. Similar sounding words can inadvertently be transcribed and may not be corrected upon review.

## 2020-07-18 NOTE — Patient Instructions (Signed)
I am happy to hear you are doing okay.  Continue current medications.    If you feel as though your abdominal swelling gets worse then let me know and we will order paracentesis.  Continue high-protein low-salt diet.  I would recommend trying to eat a protein snack before bed each evening.  This may help with your fatigue and potential muscle wasting.  Continue on lactulose and Xifaxan.  Continue on ciprofloxacin daily for SBP prophylaxis.  I think you would qualify for hospice at this time.  If you would like me to make referral then please call office and I will do so.  I would recommend discussing end-of-life questions/concerns including CODE STATUS if you were to be admitted to the hospital.  Otherwise follow-up in 6 to 8 weeks.  At [facility] we value your feedback. You may receive a survey about your visit today. Please share your experience as we strive to create trusting relationships with our patients to provide genuine, compassionate, quality care.  We appreciate your understanding and patience as we review any laboratory studies, imaging, and other diagnostic tests that are ordered as we care for you. Our office policy is 5 business days for review of these results, and any emergent or urgent results are addressed in a timely manner for your best interest. If you do not hear from our office in 1 week, please contact us.   We also encourage the use of MyChart, which contains your medical information for your review as well. If you are not enrolled in this feature, an access code is on this after visit summary for your convenience. Thank you for allowing [place] to be involved in your care.  It was great to see you today!  I hope you have a great rest of your summer!!    Elon Alas. Abbey Chatters, D.O. Gastroenterology and Hepatology [facility]

## 2020-07-23 ENCOUNTER — Telehealth: Payer: Self-pay | Admitting: Internal Medicine

## 2020-07-23 NOTE — Telephone Encounter (Signed)
[phone] PLEASE CALL PATIENT DAUGHTER, [name], SHE HAS A QUESTION

## 2020-07-24 NOTE — Telephone Encounter (Signed)
Pt's daughter called and wanted to follow-up with you regarding Rx for her father and she wants to speak to you and express her gratitude. Please call her and her father will need Rx sent to [facility]

## 2020-07-25 ENCOUNTER — Telehealth: Payer: Self-pay | Admitting: Internal Medicine

## 2020-07-25 MED ORDER — CIPROFLOXACIN HCL 500 MG PO TABS: 500.0000 mg | ORAL_TABLET | Freq: Every day | ORAL | 3 refills | Status: AC

## 2020-07-25 NOTE — Telephone Encounter (Signed)
noted 

## 2020-07-25 NOTE — Telephone Encounter (Signed)
I called and spoke with patient's daughter as well as patient.  Prescription for daily ciprofloxacin sent to [place].  Thank you

## 2020-07-31 ENCOUNTER — Telehealth: Payer: Self-pay

## 2020-07-31 NOTE — Telephone Encounter (Signed)
Script sent  

## 2020-07-31 NOTE — Telephone Encounter (Signed)
Good Morning!!!  The pt's daughter Viviano Simas is calling regarding the conversation you had last week with her. She is ready to do a referral for Hospice for her dad and wants to speak with you regarding it.

## 2020-08-02 ENCOUNTER — Telehealth: Payer: Self-pay

## 2020-08-02 NOTE — Telephone Encounter (Signed)
Pt's daughter is following up on Hospice referral

## 2020-08-02 NOTE — Telephone Encounter (Signed)
Per Dr. Abbey Chatters please send in a referral to [facility] tomorrow morning for the pt. Dr. Abbey Chatters is at the hospital tomorrow if anything is needed.

## 2020-08-03 NOTE — Telephone Encounter (Signed)
Spoke with Reba and Dr. Abbey Chatters. Dr. Abbey Chatters to call the pt's daughter

## 2020-08-03 NOTE — Telephone Encounter (Signed)
Schedulers do not send referrals to hospice or home health. The doctors nurses do these referrals.

## 2020-08-10 ENCOUNTER — Encounter: Payer: Self-pay | Admitting: Internal Medicine

## 2020-08-16 ENCOUNTER — Telehealth: Payer: Self-pay | Admitting: *Deleted

## 2020-08-16 NOTE — Telephone Encounter (Signed)
Spoke to patient's daughter Teacher, music ([employer]). Tried to get information from her, but she requested to speak to Dr. Abbey Chatters. Her number is [phone].

## 2020-08-22 ENCOUNTER — Telehealth: Payer: Self-pay | Admitting: Internal Medicine

## 2020-08-22 DIAGNOSIS — K7031 Alcoholic cirrhosis of liver with ascites: Secondary | ICD-10-CM

## 2020-08-22 NOTE — Telephone Encounter (Signed)
Labs ordered to be done prior to next visit.

## 2020-08-22 NOTE — Telephone Encounter (Signed)
Called and spoke with patient and discussed her concerns. Patient to keep follow up with me in September

## 2020-09-12 ENCOUNTER — Encounter (HOSPITAL_COMMUNITY): Payer: Self-pay

## 2020-09-12 ENCOUNTER — Emergency Department (HOSPITAL_COMMUNITY): Payer: No Typology Code available for payment source

## 2020-09-12 ENCOUNTER — Inpatient Hospital Stay (HOSPITAL_COMMUNITY)
Admission: EM | Admit: 2020-09-12 | Discharge: 2020-09-14 | DRG: 443 | Disposition: A | Discharge status: Hospice | Payer: No Typology Code available for payment source | Attending: Internal Medicine | Admitting: Internal Medicine

## 2020-09-12 ENCOUNTER — Other Ambulatory Visit: Payer: Self-pay

## 2020-09-12 DIAGNOSIS — E7800 Pure hypercholesterolemia, unspecified: Secondary | ICD-10-CM

## 2020-09-12 DIAGNOSIS — R262 Difficulty in walking, not elsewhere classified: Secondary | ICD-10-CM | POA: Diagnosis present

## 2020-09-12 DIAGNOSIS — Z8261 Family history of arthritis: Secondary | ICD-10-CM

## 2020-09-12 DIAGNOSIS — Z8249 Family history of ischemic heart disease and other diseases of the circulatory system: Secondary | ICD-10-CM

## 2020-09-12 DIAGNOSIS — K7200 Acute and subacute hepatic failure without coma: Secondary | ICD-10-CM | POA: Diagnosis not present

## 2020-09-12 DIAGNOSIS — W06XXXA Fall from bed, initial encounter: Secondary | ICD-10-CM | POA: Diagnosis present

## 2020-09-12 DIAGNOSIS — F419 Anxiety disorder, unspecified: Secondary | ICD-10-CM | POA: Diagnosis not present

## 2020-09-12 DIAGNOSIS — L89152 Pressure ulcer of sacral region, stage 2: Secondary | ICD-10-CM

## 2020-09-12 DIAGNOSIS — K7030 Alcoholic cirrhosis of liver without ascites: Secondary | ICD-10-CM | POA: Diagnosis not present

## 2020-09-12 DIAGNOSIS — Z79899 Other long term (current) drug therapy: Secondary | ICD-10-CM

## 2020-09-12 DIAGNOSIS — I10 Essential (primary) hypertension: Secondary | ICD-10-CM | POA: Diagnosis present

## 2020-09-12 DIAGNOSIS — K219 Gastro-esophageal reflux disease without esophagitis: Secondary | ICD-10-CM | POA: Diagnosis present

## 2020-09-12 DIAGNOSIS — Y92003 Bedroom of unspecified non-institutional (private) residence as the place of occurrence of the external cause: Secondary | ICD-10-CM

## 2020-09-12 DIAGNOSIS — F4310 Post-traumatic stress disorder, unspecified: Secondary | ICD-10-CM | POA: Diagnosis present

## 2020-09-12 DIAGNOSIS — Z20822 Contact with and (suspected) exposure to covid-19: Secondary | ICD-10-CM | POA: Diagnosis present

## 2020-09-12 DIAGNOSIS — F331 Major depressive disorder, recurrent, moderate: Secondary | ICD-10-CM

## 2020-09-12 DIAGNOSIS — E785 Hyperlipidemia, unspecified: Secondary | ICD-10-CM | POA: Diagnosis present

## 2020-09-12 DIAGNOSIS — F1010 Alcohol abuse, uncomplicated: Secondary | ICD-10-CM | POA: Diagnosis not present

## 2020-09-12 DIAGNOSIS — R400 Somnolence: Secondary | ICD-10-CM | POA: Diagnosis not present

## 2020-09-12 DIAGNOSIS — Z515 Encounter for palliative care: Secondary | ICD-10-CM

## 2020-09-12 DIAGNOSIS — K7290 Hepatic failure, unspecified without coma: Secondary | ICD-10-CM

## 2020-09-12 DIAGNOSIS — F32A Depression, unspecified: Secondary | ICD-10-CM | POA: Diagnosis present

## 2020-09-12 DIAGNOSIS — L89151 Pressure ulcer of sacral region, stage 1: Secondary | ICD-10-CM | POA: Diagnosis present

## 2020-09-12 DIAGNOSIS — R4182 Altered mental status, unspecified: Secondary | ICD-10-CM

## 2020-09-12 DIAGNOSIS — M19042 Primary osteoarthritis, left hand: Secondary | ICD-10-CM | POA: Diagnosis present

## 2020-09-12 DIAGNOSIS — K7460 Unspecified cirrhosis of liver: Secondary | ICD-10-CM | POA: Diagnosis present

## 2020-09-12 DIAGNOSIS — Z811 Family history of alcohol abuse and dependence: Secondary | ICD-10-CM

## 2020-09-12 DIAGNOSIS — R110 Nausea: Secondary | ICD-10-CM | POA: Diagnosis present

## 2020-09-12 DIAGNOSIS — R471 Dysarthria and anarthria: Secondary | ICD-10-CM | POA: Diagnosis present

## 2020-09-12 DIAGNOSIS — M19041 Primary osteoarthritis, right hand: Secondary | ICD-10-CM | POA: Diagnosis present

## 2020-09-12 DIAGNOSIS — F329 Major depressive disorder, single episode, unspecified: Secondary | ICD-10-CM | POA: Diagnosis present

## 2020-09-12 DIAGNOSIS — R296 Repeated falls: Secondary | ICD-10-CM | POA: Diagnosis present

## 2020-09-12 DIAGNOSIS — Z87891 Personal history of nicotine dependence: Secondary | ICD-10-CM

## 2020-09-12 DIAGNOSIS — R5383 Other fatigue: Secondary | ICD-10-CM | POA: Diagnosis present

## 2020-09-12 DIAGNOSIS — E039 Hypothyroidism, unspecified: Secondary | ICD-10-CM | POA: Diagnosis present

## 2020-09-12 DIAGNOSIS — K7682 Hepatic encephalopathy: Secondary | ICD-10-CM | POA: Diagnosis present

## 2020-09-12 LAB — COMPREHENSIVE METABOLIC PANEL
ALT: 37 U/L (ref 0–44)
AST: 64 U/L — ABNORMAL HIGH (ref 15–41)
Albumin: 2.4 g/dL — ABNORMAL LOW (ref 3.5–5.0)
Alkaline Phosphatase: 183 U/L — ABNORMAL HIGH (ref 38–126)
Anion gap: 6 (ref 5–15)
BUN: 19 mg/dL (ref 8–23)
CO2: 24 mmol/L (ref 22–32)
Calcium: 9.4 mg/dL (ref 8.9–10.3)
Chloride: 99 mmol/L (ref 98–111)
Creatinine, Ser: 1.05 mg/dL (ref 0.61–1.24)
GFR, Estimated: >60 mL/min (ref >60)
Glucose, Bld: 114 mg/dL — ABNORMAL HIGH (ref 70–99)
Potassium: 3.9 mmol/L (ref 3.5–5.1)
Sodium: 129 mmol/L — ABNORMAL LOW (ref 135–145)
Total Bilirubin: 5.6 mg/dL — ABNORMAL HIGH (ref 0.3–1.2)
Total Protein: 5.9 g/dL — ABNORMAL LOW (ref 6.5–8.1)

## 2020-09-12 LAB — URINALYSIS, ROUTINE W REFLEX MICROSCOPIC
Bilirubin Urine: NEGATIVE
Glucose, UA: NEGATIVE mg/dL
Ketones, ur: 5 mg/dL — AB
Leukocytes,Ua: NEGATIVE
Nitrite: NEGATIVE
Protein, ur: NEGATIVE mg/dL
Specific Gravity, Urine: 1.019 (ref 1.005–1.030)
pH: 6 (ref 5.0–8.0)

## 2020-09-12 LAB — CBC WITH DIFFERENTIAL/PLATELET
Abs Immature Granulocytes: 0.06 10*3/uL (ref 0.00–0.07)
Basophils Absolute: 0 10*3/uL (ref 0.0–0.1)
Basophils Relative: 0 %
Eosinophils Absolute: 0.1 10*3/uL (ref 0.0–0.5)
Eosinophils Relative: 1 %
HCT: 38.5 % — ABNORMAL LOW (ref 39.0–52.0)
Hemoglobin: 13.5 g/dL (ref 13.0–17.0)
Immature Granulocytes: 1 %
Lymphocytes Relative: 9 %
Lymphs Abs: 0.9 10*3/uL (ref 0.7–4.0)
MCH: 36.2 pg — ABNORMAL HIGH (ref 26.0–34.0)
MCHC: 35.1 g/dL (ref 30.0–36.0)
MCV: 103.2 fL — ABNORMAL HIGH (ref 80.0–100.0)
Monocytes Absolute: 0.7 10*3/uL (ref 0.1–1.0)
Monocytes Relative: 8 %
Neutro Abs: 7.9 10*3/uL — ABNORMAL HIGH (ref 1.7–7.7)
Neutrophils Relative %: 81 %
Platelets: 90 10*3/uL — ABNORMAL LOW (ref 150–400)
RBC: 3.73 MIL/uL — ABNORMAL LOW (ref 4.22–5.81)
RDW: 14.6 % (ref 11.5–15.5)
WBC: 9.8 10*3/uL (ref 4.0–10.5)
nRBC: 0 % (ref 0.0–0.2)

## 2020-09-12 LAB — RAPID URINE DRUG SCREEN, HOSP PERFORMED
Amphetamines: NOT DETECTED
Barbiturates: NOT DETECTED
Benzodiazepines: NOT DETECTED
Cocaine: NOT DETECTED
Opiates: POSITIVE — AB
Tetrahydrocannabinol: NOT DETECTED

## 2020-09-12 LAB — AMMONIA: Ammonia: 68 umol/L — ABNORMAL HIGH (ref 9–35)

## 2020-09-12 LAB — LIPASE, BLOOD: Lipase: 38 U/L (ref 11–51)

## 2020-09-12 LAB — RESP PANEL BY RT-PCR (FLU A&B, COVID) ARPGX2
Influenza A by PCR: NEGATIVE
Influenza B by PCR: NEGATIVE
SARS Coronavirus 2 by RT PCR: NEGATIVE

## 2020-09-12 LAB — PROTIME-INR
INR: 1.6 — ABNORMAL HIGH (ref 0.8–1.2)
Prothrombin Time: 19.3 seconds — ABNORMAL HIGH (ref 11.4–15.2)

## 2020-09-12 LAB — ETHANOL: Alcohol, Ethyl (B): <10 mg/dL (ref <10)

## 2020-09-12 LAB — BRAIN NATRIURETIC PEPTIDE: B Natriuretic Peptide: 36 pg/mL (ref 0.0–100.0)

## 2020-09-12 LAB — LACTIC ACID, PLASMA
Lactic Acid, Venous: 3.7 mmol/L (ref 0.5–1.9)
Lactic Acid, Venous: 1.7 mmol/L (ref 0.5–1.9)

## 2020-09-12 MED ORDER — SODIUM CHLORIDE 0.9 % IV BOLUS
500.0000 mL | Freq: Once | INTRAVENOUS | Status: AC
  Administered 2020-09-12: 500 mL via INTRAVENOUS

## 2020-09-12 MED ORDER — CIPROFLOXACIN HCL 250 MG PO TABS
500.0000 mg | ORAL_TABLET | Freq: Every day | ORAL | Status: DC
  Administered 2020-09-13: 500 mg via ORAL
  Filled 2020-09-12: qty 2

## 2020-09-12 MED ORDER — LACTULOSE 10 GM/15ML PO SOLN
30.0000 g | Freq: Two times a day (BID) | ORAL | Status: DC
  Administered 2020-09-12 – 2020-09-13 (×2): 30 g via ORAL
  Filled 2020-09-12 (×2): qty 60

## 2020-09-12 MED ORDER — RIFAXIMIN 550 MG PO TABS
550.0000 mg | ORAL_TABLET | Freq: Two times a day (BID) | ORAL | Status: DC
  Administered 2020-09-12 – 2020-09-13 (×3): 550 mg via ORAL
  Filled 2020-09-12 (×3): qty 1

## 2020-09-12 NOTE — Consult Note (Signed)
Referring Provider: No ref. provider found Primary Care Physician:  [name], MD Primary Gastroenterologist:  Dr.Carver  Date of Admission: 09/12/20 Date of Consultation: 09/12/20  Reason for Consultation:  to determine expectations of treatment/cirrhosis  HPI:  Marcus Pham is a 70 y.o. year old male with history of cirrhosis, anemia, anxiety, arthritis, depression, HE, GERD, HLD, HTN, thyroid disease, who is currently under hospice care r/t cirrhosis. Patient denies pain but had a fall from his bed last night, he reports he hit his head and neck, CT performed in ED was unremarkable to any injuries associated with his fall. He reports he is feeling poorly recently with worsening lower extremity weakness. GI consulted in regards to cirrhosis and to determine expectation of treatment, given that he is under the care of hospice currently.   ED Course:  ammonia elevated at 68, Albumin 2.4, INR 1.6, plt 90, (appears near his baseline), AST 64 and ALT 37, ALk PHos 183 (trending up from June), T bili 5.6 (down from 8.7 at end of June). WBC 9.8. sodium 129. CT head and neck unremarkable for sustained injury after reported fall.   Consult:  Patient reports he presented to ED because of more falls recently due to weakness in both of his legs that started approx 1 week ago. He reports occasional episodes of confusion at [facility] he is unable to give an exact number of occurrences recently. He is alert and oriented to person, time and situation, however, he is slow to get his thoughts together at times. He was not oriented to place as he thought he was in [place]. He states that he resides at home with his wife who helps care for him, however, no family is present at bedside at this time. He typically walks with a walker but recently has had increasing difficulty ambulating due to his legs giving out. He is aware that he is currently under the care of hospice, When asked about his goals of care  during hospitalization given that he is under hospice care, he stated "I suppose I'd like my life to last as long as possible."   He denies any obvious swelling to his abdomen. He reports usually about 5 BMs per day and states he is compliant with his at home medications, however, his mental status definitely appears to be fluctuating during assessment. Denies melena or hematochezia, denies diarrhea or constipation, he denies any swelling or pain to his lower extremities. He denies any abdominal pain, nausea, vomiting, fevers or chills.   Past Medical History:  Diagnosis Date   AKI (acute kidney injury) ([place])    Anemia    Anxiety    Arthritis    back and hands   Depression    PTSD   Encephalopathy, hepatic ([place]) 12/15/2019   GERD (gastroesophageal reflux disease)    Hyperlipidemia    Hypertension    Substance abuse ([place])    patient denies but says he drinks up to 12 beers a day   Thyroid disease     Past Surgical History:  Procedure Laterality Date   cyst     1970s-removal in [employer]    ESOPHAGOGASTRODUODENOSCOPY (EGD) WITH PROPOFOL N/A 01/10/2020   Procedure: ESOPHAGOGASTRODUODENOSCOPY (EGD) WITH PROPOFOL;  Surgeon: Eloise Harman, DO;  Location[facility];  Service: Endoscopy;  Laterality: N/A;  2:15pm   ROTATOR CUFF REPAIR Left    2002-2003?   TONSILLECTOMY     as a child    Prior to Admission medications  Medication Sig Start Date End Date Taking? Authorizing Provider  ciprofloxacin (CIPRO) 500 MG tablet Take 1 tablet (500 mg total) by mouth daily with breakfast. 07/25/20 07/25/21 Yes Carver, Elon Alas, DO  cyclobenzaprine (FLEXERIL) 10 MG tablet Take 10 mg by mouth at bedtime as needed for muscle spasms. 08/04/19  Yes [provider]  furosemide (LASIX) 40 MG tablet Take 1 tablet (40 mg total) by mouth daily. 04/26/20 04/26/21 Yes Carver, Elon Alas, DO  HYDROcodone-acetaminophen (NORCO/VICODIN) 5-325 MG tablet Take 1 tablet by mouth every 4 (four) hours as needed  for moderate pain. 08/22/20  Yes [provider]  LORazepam (ATIVAN) 0.5 MG tablet Take 0.25 mg by mouth at bedtime as needed for anxiety. 09/11/20  Yes [provider]  omeprazole (PRILOSEC) 20 MG capsule Take 20 mg by mouth as needed (acid reflux). 08/01/15  Yes [provider]  ondansetron (ZOFRAN) 4 MG tablet Take 4 mg by mouth every 8 (eight) hours as needed for nausea or vomiting.   Yes [provider]  rifaximin (XIFAXAN) 550 MG TABS tablet Take 1 tablet (550 mg total) by mouth 2 (two) times daily. Patient taking differently: Take 550 mg by mouth daily at 6 (six) AM. 04/26/20 04/26/21 Yes Carver, Elon Alas, DO  spironolactone (ALDACTONE) 100 MG tablet Take 1 tablet (100 mg total) by mouth daily. 04/26/20 04/26/21 Yes Carver, Elon Alas, DO  lactulose (CHRONULAC) 10 GM/15ML solution Take 45 mLs (30 g total) by mouth 2 (two) times daily. Patient not taking: Reported on 09/12/2020 06/30/20   Orson Eva, MD    No current facility-administered medications for this encounter.   Current Outpatient Medications  Medication Sig Dispense Refill   ciprofloxacin (CIPRO) 500 MG tablet Take 1 tablet (500 mg total) by mouth daily with breakfast. 90 tablet 3   cyclobenzaprine (FLEXERIL) 10 MG tablet Take 10 mg by mouth at bedtime as needed for muscle spasms.     furosemide (LASIX) 40 MG tablet Take 1 tablet (40 mg total) by mouth daily. 90 tablet 3   HYDROcodone-acetaminophen (NORCO/VICODIN) 5-325 MG tablet Take 1 tablet by mouth every 4 (four) hours as needed for moderate pain.     LORazepam (ATIVAN) 0.5 MG tablet Take 0.25 mg by mouth at bedtime as needed for anxiety.     omeprazole (PRILOSEC) 20 MG capsule Take 20 mg by mouth as needed (acid reflux).     ondansetron (ZOFRAN) 4 MG tablet Take 4 mg by mouth every 8 (eight) hours as needed for nausea or vomiting.     rifaximin (XIFAXAN) 550 MG TABS tablet Take 1 tablet (550 mg total) by mouth 2 (two) times daily. (Patient taking  differently: Take 550 mg by mouth daily at 6 (six) AM.) 180 tablet 3   spironolactone (ALDACTONE) 100 MG tablet Take 1 tablet (100 mg total) by mouth daily. 90 tablet 3   lactulose (CHRONULAC) 10 GM/15ML solution Take 45 mLs (30 g total) by mouth 2 (two) times daily. (Patient not taking: Reported on 09/12/2020) 946 mL 1    Allergies as of 09/12/2020   (No Known Allergies)    Family History  Problem Relation Age of Onset   Hypertension Mother    Arthritis Mother    Heart disease Father 78       CHF   Alcohol abuse Brother        substance abuse, died of trauma   Heart disease Paternal Grandfather 24       MI    Social History  Socioeconomic History   Marital status: Married    Spouse name: Not on file   Number of children: Not on file   Years of education: Not on file   Highest education level: Not on file  Occupational History   Not on file  Tobacco Use   Smoking status: Former   Smokeless tobacco: Never   Tobacco comments:    quit 40 years ago   Vaping Use   Vaping Use: Never used  Substance and Sexual Activity   Alcohol use: Not Currently    Alcohol/week: 4.0 - 5.0 standard drinks    Types: 4 - 5 Cans of beer per week   Drug use: Not Currently    Types: Marijuana    Comment: occasional marijuana use   Sexual activity: Yes  Other Topics Concern   Not on file  Social History Narrative   Not on file   Social Determinants of Health   Financial Resource Strain: Not on file  Food Insecurity: Not on file  Transportation Needs: Not on file  Physical Activity: Not on file  Stress: Not on file  Social Connections: Not on file  Intimate Partner Violence: Not on file    Review of Systems: Gen: Denies fever, chills, loss of appetite, change in weight or weight loss CV: Denies chest pain, heart palpitations, syncope, edema  Resp: Denies shortness of breath with rest, cough, wheezing GI: Denies dysphagia or odynophagia. Denies vomiting blood, jaundice, and fecal  incontinence.  GU : Denies urinary burning, urinary frequency, urinary incontinence.  MS: Denies joint pain,swelling, cramping Derm: Denies rash, itching, dry skin Psych: Denies depression, anxiety,confusion, or memory loss Heme: Denies bruising, bleeding, and enlarged lymph nodes.  Physical Exam: Vital signs in last 24 hours: Temp:  [95.6 F (35.3 C)] 95.6 F (35.3 C) (08/31 0650) Pulse Rate:  [74-86] 78 (08/31 0800) Resp:  [14-25] 14 (08/31 0800) BP: (118-152)/(61-75) 148/70 (08/31 0800) SpO2:  [100 %] 100 % (08/31 0800) Weight:  [90 kg] 90 kg (08/31 0648)   General:   Alert,  Well-developed, well-nourished, pleasant and cooperative in NAD Head:  Normocephalic and atraumatic. Eyes:  icterus of sclera Ears:  Normal auditory acuity. Nose:  No deformity, discharge,  or lesions. Mouth:  No deformity or lesions, dentition normal. Lungs:  Clear throughout to auscultation.   No wheezes, crackles, or rhonchi. No acute distress. Heart:  Regular rate and rhythm; no murmurs, clicks, rubs,  or gallops. Abdomen:  Soft, nontender and nondistended. No masses, hepatosplenomegaly or hernias noted. Normal bowel sounds, without guarding, and without rebound.   Rectal:  Deferred.   Msk:  Symmetrical without gross deformities. Normal posture. Pulses:  Normal pulses noted. Extremities:  Without clubbing or edema. Neurologic:  alert, oriented to person, time, situation, disoriented to place, mental status fluctuates during assessment. Asterixis present.  Skin:  Intact without significant lesions or rashes.skin is jaundiced. Psych:  Alert and cooperative. Flat affect, pleasant.  Lab Results: Recent Labs    09/12/20 0735  WBC 9.8  HGB 13.5  HCT 38.5*  PLT 90*   BMET Recent Labs    09/12/20 0735  NA 129*  K 3.9  CL 99  CO2 24  GLUCOSE 114*  BUN 19  CREATININE 1.05  CALCIUM 9.4   LFT Recent Labs    09/12/20 0735  PROT 5.9*  ALBUMIN 2.4*  AST 64*  ALT 37  ALKPHOS 183*  BILITOT  5.6*   PT/INR Recent Labs    09/12/20 0735  LABPROT  19.3*  INR 1.6*    Studies/Results: CT Head Wo Contrast  Result Date: 09/12/2020 CLINICAL DATA:  Polytrauma, critical, head/C-spine injury suspected. [place] out of bed. Neck pain. EXAM: CT HEAD WITHOUT CONTRAST CT CERVICAL SPINE WITHOUT CONTRAST TECHNIQUE: Multidetector CT imaging of the head and cervical spine was performed following the standard protocol without intravenous contrast. Multiplanar CT image reconstructions of the cervical spine were also generated. COMPARISON:  None. FINDINGS: CT HEAD FINDINGS Brain: There is no evidence of an acute infarct, intracranial hemorrhage, mass, midline shift, or extra-axial fluid collection. There is mild cerebral atrophy. Hypodensities in the cerebral white matter bilaterally are nonspecific but compatible with mild chronic small vessel ischemic disease. Vascular: Calcified atherosclerosis at the skull base. No hyperdense vessel. Skull: No fracture or suspicious osseous lesion. Sinuses/Orbits: 1 cm osteoma in a right ethmoid air cell. Trace bilateral mastoid effusions. Unremarkable orbits. Other: None. CT CERVICAL SPINE FINDINGS Alignment: Normal. Skull base and vertebrae: No acute fracture suspicious osseous lesion. Soft tissues and spinal canal: No prevertebral fluid or swelling. No visible canal hematoma. Disc levels: Mild cervical spondylosis, scattered mild posterior longitudinal ligament ossification, and mild facet arthrosis. Mild multilevel spinal and neural foraminal stenosis. Upper chest: Partially visualized small right pleural effusion. Other: Moderate calcific atherosclerosis at the carotid bifurcations. IMPRESSION: 1. No evidence of acute intracranial abnormality. 2. Mild chronic small vessel ischemic disease and cerebral atrophy. 3. No acute cervical spine fracture or traumatic subluxation. 4. Electronically Signed   By: Logan Bores M.D.   On: 09/12/2020 08:34   CT Cervical Spine Wo  Contrast  Result Date: 09/12/2020 CLINICAL DATA:  Polytrauma, critical, head/C-spine injury suspected. [place] out of bed. Neck pain. EXAM: CT HEAD WITHOUT CONTRAST CT CERVICAL SPINE WITHOUT CONTRAST TECHNIQUE: Multidetector CT imaging of the head and cervical spine was performed following the standard protocol without intravenous contrast. Multiplanar CT image reconstructions of the cervical spine were also generated. COMPARISON:  None. FINDINGS: CT HEAD FINDINGS Brain: There is no evidence of an acute infarct, intracranial hemorrhage, mass, midline shift, or extra-axial fluid collection. There is mild cerebral atrophy. Hypodensities in the cerebral white matter bilaterally are nonspecific but compatible with mild chronic small vessel ischemic disease. Vascular: Calcified atherosclerosis at the skull base. No hyperdense vessel. Skull: No fracture or suspicious osseous lesion. Sinuses/Orbits: 1 cm osteoma in a right ethmoid air cell. Trace bilateral mastoid effusions. Unremarkable orbits. Other: None. CT CERVICAL SPINE FINDINGS Alignment: Normal. Skull base and vertebrae: No acute fracture suspicious osseous lesion. Soft tissues and spinal canal: No prevertebral fluid or swelling. No visible canal hematoma. Disc levels: Mild cervical spondylosis, scattered mild posterior longitudinal ligament ossification, and mild facet arthrosis. Mild multilevel spinal and neural foraminal stenosis. Upper chest: Partially visualized small right pleural effusion. Other: Moderate calcific atherosclerosis at the carotid bifurcations. IMPRESSION: 1. No evidence of acute intracranial abnormality. 2. Mild chronic small vessel ischemic disease and cerebral atrophy. 3. No acute cervical spine fracture or traumatic subluxation. 4. Electronically Signed   By: Logan Bores M.D.   On: 09/12/2020 08:34    Impression: 70 year old male with pmh of anemia, anxiety, arthritis, depression, HE, GERD, HLD, HTN, thyroid disease and cirrhosis that  has been complicated by episodes of decompensation. Patient currently under hospice care. He presented to the ED for c/o leg weakness and recent falls. GI consulted for determination of goals of treatment regarding patient's cirrhosis.  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 Last paracentesis in June 2022, yielded 3.3 Liters of fluid with cell count showing PMN of 573, concerning for SBP. Patient was admitted to [facility] and treated with IV rocephin, and given IV albumin.He subsequently had acute kidney injury at that time as well. He is maintained on daily Cipro for SBP prophylaxis. Outpatient Elastography in Nov 2021 showed elevated median kPa of 37.8. EGD 01/10/20 for variceal screening without any evidence of esophageal varices. He is currently maintained on Xifaxan and lactulose outpatient for previous history of HE, with some reported non compliance in the past, per reviewable EMR notes. He takes Lasix 68m and Spironolactone 1048mdaily and attempts to maintain low sodium diet. He is without ascites today.  LFTs appear to be close to patient's baseline. INR 1.6, albumin 2.4,  plt 90, AST 64 and ALT 37, ALk Phos 183 (trending up from June), T bili 5.6.  Ammonia elevated at 68, patient is alert with fluctuating mental status, he was able to answer appropriately regarding person, situation and time during assessment, disoriented to place. He is definitely slow to gather his thoughts/respond. He reports compliance with outpatient medications for his cirrhosis, however, given fluctuating mental status and lack of family at bedside, it is hard to know if this is the case for sure. We will continue cipro daily for SBP prophylaxis. He exhibits no signs of SBP, currently, WBC 9.8. He denies abdominal pain, nausea, vomiting, fever or chills. Lactulose and xifaxin to be continued as well.  Will start Pantoprazole 4027maily.   MELD-Na: 25 Child Pugh: 8 Child Class B  Given patient's acute onset of lower extremity weakness, we will check for other causes such as UTI or blood born infection.   Plan: Will order Lactulose BID and xifaxin  Continue to monitor for worsening hepatic encephalopathy Continue to trend LFTs and INR daily Should continue Cipro for sbp prophylaxis Continue to monitor for gross ascites  Should continue current regiment of spironolactone 100m67md lasix 40mg68m daily    LOS: 0 days    09/12/2020, 11:34 AM  Arisa Congleton L. CarlaAlver Sorrow, APRN, AGNP-C Adult-Gerontology Nurse Practitioner [facility]

## 2020-09-12 NOTE — H&P (Signed)
[facility] History and Physical  Marcus Pham [id number] DOB: [birth year] DOA: 09/12/2020  Referring physician:  PCP: [name], MD   Chief Complaint: Hepatic encephalopathy, fall  HPI: Marcus Pham is a 70 y.o.  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  HEENT:  No headaches, Difficulty swallowing,Tooth/dental problems,Sore throat,  No sneezing, itching, ear ache, nasal congestion, post nasal drip,  Cardio-vascular:  No chest pain, Orthopnea, PND, swelling in lower extremities, anasarca, dizziness, palpitations  GI:  No heartburn, indigestion, abdominal pain, nausea, vomiting, diarrhea, change in bowel habits, loss of appetite  Resp:  No shortness of breath with exertion or at rest. No excess mucus, no productive cough, No non-productive cough, No coughing up of blood.No change in color of mucus.No wheezing.No chest wall deformity  Skin:  no rash or lesions.  GU:  no dysuria, change in color of urine, no urgency or frequency. No flank pain.  Musculoskeletal:  No joint pain or swelling. No decreased range of motion. No back pain.  Psych:  No change in mood or affect. No depression or anxiety. No memory loss.   Past Medical History:  Diagnosis Date   AKI (acute kidney injury) ([place])    Anemia    Anxiety    Arthritis    back and hands   Depression    PTSD   Encephalopathy, hepatic ([place]) 12/15/2019   GERD (gastroesophageal reflux disease)    Hyperlipidemia    Hypertension    Substance abuse ([place])    patient denies but says he drinks up to 12 beers a day   Thyroid disease    Past Surgical History:  Procedure Laterality Date   cyst     1970s-removal in [employer]    ESOPHAGOGASTRODUODENOSCOPY (EGD) WITH PROPOFOL N/A 01/10/2020   Procedure:  ESOPHAGOGASTRODUODENOSCOPY (EGD) WITH PROPOFOL;  Surgeon: Eloise Harman, DO;  Location: [facility];  Service: Endoscopy;  Laterality: N/A;  2:15pm   ROTATOR CUFF REPAIR Left    2002-2003?   TONSILLECTOMY     as a child   Social History:  reports that he has quit smoking. He has never used smokeless tobacco. He reports that he does not currently use alcohol after a past usage of about 4.0 - 5.0 standard drinks per week. He reports that he does not currently use drugs after having used the following drugs: Marijuana.  No Known Allergies  Family History  Problem Relation Age of Onset   Hypertension Mother    Arthritis Mother    Heart disease Father 8       CHF   Alcohol abuse Brother        substance abuse, died of trauma   Heart disease Paternal Grandfather 69       MI    Prior to Admission medications   Medication Sig Start Date End Date Taking? Authorizing Provider  ciprofloxacin (CIPRO) 500 MG tablet Take 1 tablet (500 mg total) by mouth daily with breakfast. 07/25/20 07/25/21 Yes Carver, Elon Alas, DO  cyclobenzaprine (FLEXERIL) 10 MG tablet Take 10 mg by mouth at bedtime as needed for muscle spasms. 08/04/19  Yes [provider]  furosemide (LASIX) 40 MG tablet Take 1 tablet (40 mg total) by mouth daily. 04/26/20 04/26/21 Yes Eloise Harman, DO  HYDROcodone-acetaminophen (NORCO/VICODIN) 5-325 MG  tablet Take 1 tablet by mouth every 4 (four) hours as needed for moderate pain. 08/22/20  Yes [provider]  LORazepam (ATIVAN) 0.5 MG tablet Take 0.25 mg by mouth at bedtime as needed for anxiety. 09/11/20  Yes [provider]  omeprazole (PRILOSEC) 20 MG capsule Take 20 mg by mouth as needed (acid reflux). 08/01/15  Yes [provider]  ondansetron (ZOFRAN) 4 MG tablet Take 4 mg by mouth every 8 (eight) hours as needed for nausea or vomiting.   Yes [provider]  rifaximin (XIFAXAN) 550 MG TABS tablet Take 1 tablet (550 mg total) by mouth  2 (two) times daily. Patient taking differently: Take 550 mg by mouth daily at 6 (six) AM. 04/26/20 04/26/21 Yes Carver, Elon Alas, DO  spironolactone (ALDACTONE) 100 MG tablet Take 1 tablet (100 mg total) by mouth daily. 04/26/20 04/26/21 Yes Carver, Elon Alas, DO  lactulose (CHRONULAC) 10 GM/15ML solution Take 45 mLs (30 g total) by mouth 2 (two) times daily. Patient not taking: Reported on 09/12/2020 06/30/20   Orson Eva, MD     Consultants:  Date of care NP Tasha  Procedures/Significant Events:    I have personally reviewed and interpreted all radiology studies and my findings are as above.   VENTILATOR SETTINGS:    Cultures   Antimicrobials:    Devices    LINES / TUBES:      Continuous Infusions:  Physical Exam: Vitals:   09/12/20 0800 09/12/20 1330 09/12/20 1400 09/12/20 1430  BP: (!) 148/70 (!) 120/55 (!) 111/56 (!) 114/57  Pulse: 78 79 78 76  Resp: '14 16 19 16  '$ Temp:      SpO2: 100% 100% 100% 100%  Weight:      Height:        Wt Readings from Last 3 Encounters:  09/12/20 90 kg  07/18/20 91.9 kg  06/29/20 98 kg    General: A/O x4, No acute respiratory distress Eyes: negative scleral hemorrhage, negative anisocoria, positive icterus ENT: Negative Runny nose, negative gingival bleeding, Neck:  Negative scars, masses, torticollis, lymphadenopathy, JVD Lungs: Clear to auscultation bilaterally without wheezes or crackles Cardiovascular: Regular rate and rhythm without murmur gallop or rub normal S1 and S2 Abdomen: negative abdominal pain, nondistended, positive soft, bowel sounds, no rebound, no ascites, no appreciable mass Extremities: No significant cyanosis, clubbing, or edema bilateral lower extremities Skin: positive jaundice, stage I sacral decubitus ulcer  Psychiatric:  Negative depression, negative anxiety, negative fatigue, negative mania  Central nervous system:  Cranial nerves II through XII intact, tongue/uvula midline, all extremities muscle  strength 5/5, sensation intact throughout, fnegative dysarthria, negative expressive aphasia, negative receptive aphasia.        Labs on Admission:  Basic Metabolic Panel: Recent Labs  Lab 09/12/20 0735  NA 129*  K 3.9  CL 99  CO2 24  GLUCOSE 114*  BUN 19  CREATININE 1.05  CALCIUM 9.4   Liver Function Tests: Recent Labs  Lab 09/12/20 0735  AST 64*  ALT 37  ALKPHOS 183*  BILITOT 5.6*  PROT 5.9*  ALBUMIN 2.4*   Recent Labs  Lab 09/12/20 0735  LIPASE 38   Recent Labs  Lab 09/12/20 0738  AMMONIA 68*   CBC: Recent Labs  Lab 09/12/20 0735  WBC 9.8  NEUTROABS 7.9*  HGB 13.5  HCT 38.5*  MCV 103.2*  PLT 90*   Cardiac Enzymes: No results for input(s): CKTOTAL, CKMB, CKMBINDEX, TROPONINI in the last 168 hours.  BNP (last 3 results)  Recent Labs    09/12/20 0736  BNP 36.0    ProBNP (last 3 results) No results for input(s): PROBNP in the last 8760 hours.  CBG: No results for input(s): GLUCAP in the last 168 hours.  Radiological Exams on Admission: CT Head Wo Contrast  Result Date: 09/12/2020 CLINICAL DATA:  Polytrauma, critical, head/C-spine injury suspected. [place] out of bed. Neck pain. EXAM: CT HEAD WITHOUT CONTRAST CT CERVICAL SPINE WITHOUT CONTRAST TECHNIQUE: Multidetector CT imaging of the head and cervical spine was performed following the standard protocol without intravenous contrast. Multiplanar CT image reconstructions of the cervical spine were also generated. COMPARISON:  None. FINDINGS: CT HEAD FINDINGS Brain: There is no evidence of an acute infarct, intracranial hemorrhage, mass, midline shift, or extra-axial fluid collection. There is mild cerebral atrophy. Hypodensities in the cerebral white matter bilaterally are nonspecific but compatible with mild chronic small vessel ischemic disease. Vascular: Calcified atherosclerosis at the skull base. No hyperdense vessel. Skull: No fracture or suspicious osseous lesion. Sinuses/Orbits: 1 cm osteoma in a  right ethmoid air cell. Trace bilateral mastoid effusions. Unremarkable orbits. Other: None. CT CERVICAL SPINE FINDINGS Alignment: Normal. Skull base and vertebrae: No acute fracture suspicious osseous lesion. Soft tissues and spinal canal: No prevertebral fluid or swelling. No visible canal hematoma. Disc levels: Mild cervical spondylosis, scattered mild posterior longitudinal ligament ossification, and mild facet arthrosis. Mild multilevel spinal and neural foraminal stenosis. Upper chest: Partially visualized small right pleural effusion. Other: Moderate calcific atherosclerosis at the carotid bifurcations. IMPRESSION: 1. No evidence of acute intracranial abnormality. 2. Mild chronic small vessel ischemic disease and cerebral atrophy. 3. No acute cervical spine fracture or traumatic subluxation. 4. Electronically Signed   By: Logan Bores M.D.   On: 09/12/2020 08:34   CT Cervical Spine Wo Contrast  Result Date: 09/12/2020 CLINICAL DATA:  Polytrauma, critical, head/C-spine injury suspected. [place] out of bed. Neck pain. EXAM: CT HEAD WITHOUT CONTRAST CT CERVICAL SPINE WITHOUT CONTRAST TECHNIQUE: Multidetector CT imaging of the head and cervical spine was performed following the standard protocol without intravenous contrast. Multiplanar CT image reconstructions of the cervical spine were also generated. COMPARISON:  None. FINDINGS: CT HEAD FINDINGS Brain: There is no evidence of an acute infarct, intracranial hemorrhage, mass, midline shift, or extra-axial fluid collection. There is mild cerebral atrophy. Hypodensities in the cerebral white matter bilaterally are nonspecific but compatible with mild chronic small vessel ischemic disease. Vascular: Calcified atherosclerosis at the skull base. No hyperdense vessel. Skull: No fracture or suspicious osseous lesion. Sinuses/Orbits: 1 cm osteoma in a right ethmoid air cell. Trace bilateral mastoid effusions. Unremarkable orbits. Other: None. CT CERVICAL SPINE FINDINGS  Alignment: Normal. Skull base and vertebrae: No acute fracture suspicious osseous lesion. Soft tissues and spinal canal: No prevertebral fluid or swelling. No visible canal hematoma. Disc levels: Mild cervical spondylosis, scattered mild posterior longitudinal ligament ossification, and mild facet arthrosis. Mild multilevel spinal and neural foraminal stenosis. Upper chest: Partially visualized small right pleural effusion. Other: Moderate calcific atherosclerosis at the carotid bifurcations. IMPRESSION: 1. No evidence of acute intracranial abnormality. 2. Mild chronic small vessel ischemic disease and cerebral atrophy. 3. No acute cervical spine fracture or traumatic subluxation. 4. Electronically Signed   By: Logan Bores M.D.   On: 09/12/2020 08:34    EKG: Independently reviewed.   Assessment/Plan Active Problems:   Essential hypertension   Hyperlipidemia   Acute hepatic encephalopathy   Cirrhosis ([place])   Alcohol abuse   Major depressive disorder, recurrent episode, moderate ([place])  Posttraumatic stress disorder   Anxiety  Hepatic encephalopathy -  End-stage Liver failure, EtOH cirrhosis -  EtOH abuse -  Anxiety, PTSD, major depressive DO -  Essential HTN -  HLD -   Sacral decubitus ulcer stage I Pressure Injury 09/12/20 Sacrum Mid Stage 1 -  Intact skin with non-blanchable redness of a localized area usually over a bony prominence. redness outter and flesh colored inside wound bed, length 2 cm , width 0, depth 0 (Active)  09/12/20 1908  Location: Sacrum  Location Orientation: Mid  Staging: Stage 1 -  Intact skin with non-blanchable redness of a localized area usually over a bony prominence.  Wound Description (Comments): redness outter and flesh colored inside wound bed, length 2 cm , width 0, depth 0  Present on Admission: Yes   Goals of care - Spoke at length with NP Tasha palliative care patient is supposed to be hospice patient, plan is to sort out in a.m. what  patient and family goals of care are.  Residential hospice?     Code Status: DNR  (DVT Prophylaxis: SCD Family Communication:   Status is: Inpatient    Dispo: The patient is from: Home              Anticipated d/c is to: Home or Residential Hospice              Anticipated d/c date is: 2 days              Patient currently is not medically stable to d/c.     Data Reviewed: Care during the described time interval was provided by me .  I have reviewed this patient's available data, including medical history, events of note, physical examination, and all test results as part of my evaluation.   The patient is critically ill with multiple organ systems failure and requires high complexity decision making for assessment and support, frequent evaluation and titration of therapies, application of advanced monitoring technologies and extensive interpretation of multiple databases. Critical Care Time devoted to patient care services described in this note  Time spent: 70 minutes   Charnel Giles, [facility]

## 2020-09-12 NOTE — TOC Progression Note (Signed)
Transition of Care [facility]) - Progression Note    Patient Details  Name: ARTHELL AMBUSH MRN: [id number] Date of Birth: [birth year]  [facility]) CM/SW Contact  Boneta Lucks, RN Phone Number: 09/12/2020, 3:41 PM  Clinical Narrative:   Patient being admitted, active with [facility] at home. TOC updated Hospice with OBS admission. [place] Case manager will come to assess the patient tomorrow. TOC will continue to update Hospice.

## 2020-09-12 NOTE — ED Notes (Signed)
Date and time results received: 09/12/20 8:17 AM  Test: lactic acid Critical Value: 3.7  Name of Provider Notified: [email or web address]  Orders Received? Or Actions Taken?: give 526m fluid

## 2020-09-12 NOTE — ED Provider Notes (Signed)
[facility] EMERGENCY DEPARTMENT Provider Note   CSN: [id number] Arrival date & time: 09/12/20  0645     History Chief Complaint  Patient presents with   Marcus Pham is a 70 y.o. male.  HPI Patient presents from home.  Patient has a history of cirrhosis, is enrolled in hospice care.  He provides some details of his history himself, additional details are provided on the chart review from phone call notes.  Patient denies pain, but seemingly had a fall earlier tonight.  He reportedly fell from his bed, hit his head, neck, complain of pain in those areas, though he does not do so currently.  He is perseverant on feeling poorly," declining" to use his word.  He does not specify declining a particular aspect, nor focal weakness, and again denies focal pain.  He is unsure of fever, seemingly denies vomiting, has had possibly been taking his medication as directed.  He has a history of cirrhosis.  Patient cannot specify when he began declining, or any alleviating or exacerbating factors.    Past Medical History:  Diagnosis Date   AKI (acute kidney injury) ([place])    Anemia    Anxiety    Arthritis    back and hands   Depression    PTSD   Encephalopathy, hepatic ([place]) 12/15/2019   GERD (gastroesophageal reflux disease)    Hyperlipidemia    Hypertension    Substance abuse ([place])    patient denies but says he drinks up to 12 beers a day   Thyroid disease     Patient Active Problem List   Diagnosis Date Noted   Anxiety 09/12/2020   Hepatic encephalopathy ([place]) 09/12/2020   Elevated bilirubin    Anemia    Spontaneous bacterial peritonitis ([place]) 06/27/2020   SBP (spontaneous bacterial peritonitis) ([place])    AKI (acute kidney injury) ([place])    Cough 04/23/2020   Bronchitis 04/06/2020   Alcohol abuse 04/02/2020   Major depressive disorder, recurrent episode, 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   Obesity 04/02/2020   Posttraumatic stress disorder 04/02/2020   Acute hepatic  encephalopathy 12/15/2019   Ascites 12/15/2019   Cirrhosis ([place]) 12/15/2019   Portal hypertension ([place]) 12/15/2019   Low back pain 10/19/2019   Vitamin D deficiency 09/25/2015   Essential hypertension 09/10/2015   Hyperlipidemia 09/10/2015   GERD (gastroesophageal reflux disease) 09/10/2015    Past Surgical History:  Procedure Laterality Date   cyst     1970s-removal in [employer]    ESOPHAGOGASTRODUODENOSCOPY (EGD) WITH PROPOFOL N/A 01/10/2020   Procedure: ESOPHAGOGASTRODUODENOSCOPY (EGD) WITH PROPOFOL;  Surgeon: Eloise Harman, DO;  Location: [facility] SU[facility];  Service: Endoscopy;  Laterality: N/A;  2:15pm   ROTATOR CUFF REPAIR Left    2002-2003?   TONSILLECTOMY     as a child       Family History  Problem Relation Age of Onset   Hypertension Mother    Arthritis Mother    Heart disease Father 8       CHF   Alcohol abuse Brother        substance abuse, died of trauma   Heart disease Paternal Grandfather 37       MI    Social History   Tobacco Use   Smoking status: Former   Smokeless tobacco: Never   Tobacco comments:    quit 40 years ago   Vaping Use   Vaping Use: Never used  Substance Use Topics   Alcohol use: Not  Currently    Alcohol/week: 4.0 - 5.0 standard drinks    Types: 4 - 5 Cans of beer per week   Drug use: Not Currently    Types: Marijuana    Comment: occasional marijuana use    Home Medications Prior to Admission medications   Medication Sig Start Date End Date Taking? Authorizing Provider  ciprofloxacin (CIPRO) 500 MG tablet Take 1 tablet (500 mg total) by mouth daily with breakfast. 07/25/20 07/25/21 Yes Carver, Elon Alas, DO  cyclobenzaprine (FLEXERIL) 10 MG tablet Take 10 mg by mouth at bedtime as needed for muscle spasms. 08/04/19  Yes [provider]  furosemide (LASIX) 40 MG tablet Take 1 tablet (40 mg total) by mouth daily. 04/26/20 04/26/21 Yes Carver, Elon Alas, DO  HYDROcodone-acetaminophen (NORCO/VICODIN) 5-325 MG tablet Take 1  tablet by mouth every 4 (four) hours as needed for moderate pain. 08/22/20  Yes [provider]  LORazepam (ATIVAN) 0.5 MG tablet Take 0.25 mg by mouth at bedtime as needed for anxiety. 09/11/20  Yes [provider]  omeprazole (PRILOSEC) 20 MG capsule Take 20 mg by mouth as needed (acid reflux). 08/01/15  Yes [provider]  ondansetron (ZOFRAN) 4 MG tablet Take 4 mg by mouth every 8 (eight) hours as needed for nausea or vomiting.   Yes [provider]  rifaximin (XIFAXAN) 550 MG TABS tablet Take 1 tablet (550 mg total) by mouth 2 (two) times daily. Patient taking differently: Take 550 mg by mouth daily at 6 (six) AM. 04/26/20 04/26/21 Yes Carver, Elon Alas, DO  spironolactone (ALDACTONE) 100 MG tablet Take 1 tablet (100 mg total) by mouth daily. 04/26/20 04/26/21 Yes Carver, Elon Alas, DO  lactulose (CHRONULAC) 10 GM/15ML solution Take 45 mLs (30 g total) by mouth 2 (two) times daily. Patient not taking: Reported on 09/12/2020 06/30/20   Orson Eva, MD    Allergies    Patient has no known allergies.  Review of Systems   Review of Systems  Constitutional:        Per HPI, otherwise negative  HENT:         Per HPI, otherwise negative  Respiratory:         Per HPI, otherwise negative  Cardiovascular:        Per HPI, otherwise negative  Gastrointestinal:  Negative for vomiting.  Endocrine:       Negative aside from HPI  Genitourinary:        Neg aside from HPI   Musculoskeletal:        Per HPI, otherwise negative  Skin:  Positive for color change.  Neurological:  Positive for weakness. Negative for syncope.   Physical Exam Updated Vital Signs BP (!) 114/57   Pulse 76   Temp (!) 95.6 F (35.3 C)   Resp 16   Ht '6\' 1"'$  (1.854 m)   Wt 90 kg   SpO2 100%   BMI 26.18 kg/m   Physical Exam Vitals and nursing note reviewed.  Constitutional:      Appearance: He is well-developed. He is ill-appearing.  HENT:     Head: Normocephalic and atraumatic.   Eyes:     General: Scleral icterus present.  Cardiovascular:     Rate and Rhythm: Normal rate and regular rhythm.  Pulmonary:     Effort: Pulmonary effort is normal. No respiratory distress.     Breath sounds: No stridor.  Abdominal:     General: There is no distension.  Skin:    General: Skin  is warm and dry.     Coloration: Skin is jaundiced.  Neurological:     Mental Status: He is oriented to person, place, and time.     Motor: Atrophy present. No tremor.     Comments: No asterixis    ED Results / Procedures / Treatments   Labs (all labs ordered are listed, but only abnormal results are displayed) Labs Reviewed  COMPREHENSIVE METABOLIC PANEL - Abnormal; Notable for the following components:      Result Value   Sodium 129 (*)    Glucose, Bld 114 (*)    Total Protein 5.9 (*)    Albumin 2.4 (*)    AST 64 (*)    Alkaline Phosphatase 183 (*)    Total Bilirubin 5.6 (*)    All other components within normal limits  LACTIC ACID, PLASMA - Abnormal; Notable for the following components:   Lactic Acid, Venous 3.7 (*)    All other components within normal limits  CBC WITH DIFFERENTIAL/PLATELET - Abnormal; Notable for the following components:   RBC 3.73 (*)    HCT 38.5 (*)    MCV 103.2 (*)    MCH 36.2 (*)    Platelets 90 (*)    Neutro Abs 7.9 (*)    All other components within normal limits  URINALYSIS, ROUTINE W REFLEX MICROSCOPIC - Abnormal; Notable for the following components:   Color, Urine AMBER (*)    Hgb urine dipstick MODERATE (*)    Ketones, ur 5 (*)    Bacteria, UA RARE (*)    All other components within normal limits  PROTIME-INR - Abnormal; Notable for the following components:   Prothrombin Time 19.3 (*)    INR 1.6 (*)    All other components within normal limits  AMMONIA - Abnormal; Notable for the following components:   Ammonia 68 (*)    All other components within normal limits  SARS CORONAVIRUS 2 (TAT 6-24 HRS)  CULTURE, BLOOD (ROUTINE X 2)   CULTURE, BLOOD (ROUTINE X 2)  LIPASE, BLOOD  BRAIN NATRIURETIC PEPTIDE  LACTIC ACID, PLASMA     Radiology CT Head Wo Contrast  Result Date: 09/12/2020 CLINICAL DATA:  Polytrauma, critical, head/C-spine injury suspected. [place] out of bed. Neck pain. EXAM: CT HEAD WITHOUT CONTRAST CT CERVICAL SPINE WITHOUT CONTRAST TECHNIQUE: Multidetector CT imaging of the head and cervical spine was performed following the standard protocol without intravenous contrast. Multiplanar CT image reconstructions of the cervical spine were also generated. COMPARISON:  None. FINDINGS: CT HEAD FINDINGS Brain: There is no evidence of an acute infarct, intracranial hemorrhage, mass, midline shift, or extra-axial fluid collection. There is mild cerebral atrophy. Hypodensities in the cerebral white matter bilaterally are nonspecific but compatible with mild chronic small vessel ischemic disease. Vascular: Calcified atherosclerosis at the skull base. No hyperdense vessel. Skull: No fracture or suspicious osseous lesion. Sinuses/Orbits: 1 cm osteoma in a right ethmoid air cell. Trace bilateral mastoid effusions. Unremarkable orbits. Other: None. CT CERVICAL SPINE FINDINGS Alignment: Normal. Skull base and vertebrae: No acute fracture suspicious osseous lesion. Soft tissues and spinal canal: No prevertebral fluid or swelling. No visible canal hematoma. Disc levels: Mild cervical spondylosis, scattered mild posterior longitudinal ligament ossification, and mild facet arthrosis. Mild multilevel spinal and neural foraminal stenosis. Upper chest: Partially visualized small right pleural effusion. Other: Moderate calcific atherosclerosis at the carotid bifurcations. IMPRESSION: 1. No evidence of acute intracranial abnormality. 2. Mild chronic small vessel ischemic disease and cerebral atrophy. 3. No acute cervical spine fracture  or traumatic subluxation. 4. Electronically Signed   By: Logan Bores M.D.   On: 09/12/2020 08:34   CT Cervical  Spine Wo Contrast  Result Date: 09/12/2020 CLINICAL DATA:  Polytrauma, critical, head/C-spine injury suspected. [place] out of bed. Neck pain. EXAM: CT HEAD WITHOUT CONTRAST CT CERVICAL SPINE WITHOUT CONTRAST TECHNIQUE: Multidetector CT imaging of the head and cervical spine was performed following the standard protocol without intravenous contrast. Multiplanar CT image reconstructions of the cervical spine were also generated. COMPARISON:  None. FINDINGS: CT HEAD FINDINGS Brain: There is no evidence of an acute infarct, intracranial hemorrhage, mass, midline shift, or extra-axial fluid collection. There is mild cerebral atrophy. Hypodensities in the cerebral white matter bilaterally are nonspecific but compatible with mild chronic small vessel ischemic disease. Vascular: Calcified atherosclerosis at the skull base. No hyperdense vessel. Skull: No fracture or suspicious osseous lesion. Sinuses/Orbits: 1 cm osteoma in a right ethmoid air cell. Trace bilateral mastoid effusions. Unremarkable orbits. Other: None. CT CERVICAL SPINE FINDINGS Alignment: Normal. Skull base and vertebrae: No acute fracture suspicious osseous lesion. Soft tissues and spinal canal: No prevertebral fluid or swelling. No visible canal hematoma. Disc levels: Mild cervical spondylosis, scattered mild posterior longitudinal ligament ossification, and mild facet arthrosis. Mild multilevel spinal and neural foraminal stenosis. Upper chest: Partially visualized small right pleural effusion. Other: Moderate calcific atherosclerosis at the carotid bifurcations. IMPRESSION: 1. No evidence of acute intracranial abnormality. 2. Mild chronic small vessel ischemic disease and cerebral atrophy. 3. No acute cervical spine fracture or traumatic subluxation. 4. Electronically Signed   By: Logan Bores M.D.   On: 09/12/2020 08:34    Procedures Procedures   Medications Ordered in ED Medications  sodium chloride 0.9 % bolus 500 mL (0 mLs Intravenous Stopped  09/12/20 1332)    ED Course  I have reviewed the triage vital signs and the nursing notes.  Pertinent labs & imaging results that were available during my care of the patient were reviewed by me and considered in my medical decision making (see chart for details).  Patient's pill bottles reviewed after my initial conversation with him.  Patient is taking benzodiazepines, narcotics, antiemetics.  No lactulose noted.  With consideration of declining status from cirrhosis versus altered mental status from polypharmacy versus fall patient had labs, CT ordered after my initial evaluation.  3:35 PM I reviewed the patient's CT scan, labs, previously discussed them with his gastroenterology team, and now discussed them with our internal medicine colleagues and with patient's wife at bedside. Hospice has been paged, even with her social work colleagues facilitating will be contacted.  Elderly male with progressive cirrhosis presents with altered mental status, concern for encephalopathy versus SBP.  Patient is afebrile, lower suspicion for this latter etiology, more for the former.  After discussion with gastroenterology patient was started on appropriate meds, admitted to the hospital for monitoring, management facilitation of hospice discussions. MDM Rules/Calculators/A&P MDM Number of Diagnoses or Management Options Altered mental status: new, needed workup   Amount and/or Complexity of Data Reviewed Clinical lab tests: ordered and reviewed Tests in the radiology section of CPT: ordered and reviewed Tests in the medicine section of CPT: reviewed and ordered Decide to obtain previous medical records or to obtain history from someone other than the patient: yes Obtain history from someone other than the patient: yes Review and summarize past medical records: yes Discuss the patient with other providers: yes Independent visualization of images, tracings, or specimens: yes  Risk of  Complications, Morbidity, and/or  Mortality Presenting problems: high Diagnostic procedures: high Management options: high  Critical Care Total time providing critical care: < 30 minutes  Patient Progress Patient progress: stable   Final Clinical Impression(s) / ED Diagnoses Final diagnoses:  Altered mental status    Rx / DC Orders ED Discharge Orders     None        Carmin Muskrat, MD 09/12/20 1536

## 2020-09-12 NOTE — ED Triage Notes (Addendum)
EMS- pt family called because pt fell out of the bed tonight. Pt says he hit head and now neck hurts. Pt self medicates. Has history of liver cancer. Pt is alert to self and follows commands.

## 2020-09-13 ENCOUNTER — Ambulatory Visit: Payer: No Typology Code available for payment source | Admitting: Internal Medicine

## 2020-09-13 ENCOUNTER — Encounter (HOSPITAL_COMMUNITY): Payer: Self-pay | Admitting: Internal Medicine

## 2020-09-13 DIAGNOSIS — R296 Repeated falls: Secondary | ICD-10-CM | POA: Diagnosis present

## 2020-09-13 DIAGNOSIS — F4310 Post-traumatic stress disorder, unspecified: Secondary | ICD-10-CM | POA: Diagnosis present

## 2020-09-13 DIAGNOSIS — F32A Depression, unspecified: Secondary | ICD-10-CM | POA: Diagnosis present

## 2020-09-13 DIAGNOSIS — F419 Anxiety disorder, unspecified: Secondary | ICD-10-CM | POA: Diagnosis not present

## 2020-09-13 DIAGNOSIS — R5383 Other fatigue: Secondary | ICD-10-CM | POA: Diagnosis present

## 2020-09-13 DIAGNOSIS — K7030 Alcoholic cirrhosis of liver without ascites: Secondary | ICD-10-CM

## 2020-09-13 DIAGNOSIS — I10 Essential (primary) hypertension: Secondary | ICD-10-CM | POA: Diagnosis present

## 2020-09-13 DIAGNOSIS — Z79899 Other long term (current) drug therapy: Secondary | ICD-10-CM | POA: Diagnosis not present

## 2020-09-13 DIAGNOSIS — M19041 Primary osteoarthritis, right hand: Secondary | ICD-10-CM | POA: Diagnosis present

## 2020-09-13 DIAGNOSIS — Z515 Encounter for palliative care: Secondary | ICD-10-CM

## 2020-09-13 DIAGNOSIS — K7200 Acute and subacute hepatic failure without coma: Secondary | ICD-10-CM | POA: Diagnosis present

## 2020-09-13 DIAGNOSIS — Y92003 Bedroom of unspecified non-institutional (private) residence as the place of occurrence of the external cause: Secondary | ICD-10-CM | POA: Diagnosis not present

## 2020-09-13 DIAGNOSIS — R110 Nausea: Secondary | ICD-10-CM | POA: Diagnosis present

## 2020-09-13 DIAGNOSIS — R400 Somnolence: Secondary | ICD-10-CM | POA: Diagnosis not present

## 2020-09-13 DIAGNOSIS — F329 Major depressive disorder, single episode, unspecified: Secondary | ICD-10-CM | POA: Diagnosis present

## 2020-09-13 DIAGNOSIS — Z8261 Family history of arthritis: Secondary | ICD-10-CM | POA: Diagnosis not present

## 2020-09-13 DIAGNOSIS — W06XXXA Fall from bed, initial encounter: Secondary | ICD-10-CM | POA: Diagnosis present

## 2020-09-13 DIAGNOSIS — Z811 Family history of alcohol abuse and dependence: Secondary | ICD-10-CM | POA: Diagnosis not present

## 2020-09-13 DIAGNOSIS — K7290 Hepatic failure, unspecified without coma: Secondary | ICD-10-CM | POA: Diagnosis present

## 2020-09-13 DIAGNOSIS — E785 Hyperlipidemia, unspecified: Secondary | ICD-10-CM | POA: Diagnosis present

## 2020-09-13 DIAGNOSIS — F1010 Alcohol abuse, uncomplicated: Secondary | ICD-10-CM | POA: Diagnosis present

## 2020-09-13 DIAGNOSIS — R471 Dysarthria and anarthria: Secondary | ICD-10-CM | POA: Diagnosis present

## 2020-09-13 DIAGNOSIS — M19042 Primary osteoarthritis, left hand: Secondary | ICD-10-CM | POA: Diagnosis present

## 2020-09-13 DIAGNOSIS — Z87891 Personal history of nicotine dependence: Secondary | ICD-10-CM | POA: Diagnosis not present

## 2020-09-13 DIAGNOSIS — K219 Gastro-esophageal reflux disease without esophagitis: Secondary | ICD-10-CM | POA: Diagnosis present

## 2020-09-13 DIAGNOSIS — L89151 Pressure ulcer of sacral region, stage 1: Secondary | ICD-10-CM | POA: Diagnosis present

## 2020-09-13 DIAGNOSIS — Z20822 Contact with and (suspected) exposure to covid-19: Secondary | ICD-10-CM | POA: Diagnosis present

## 2020-09-13 DIAGNOSIS — Z8249 Family history of ischemic heart disease and other diseases of the circulatory system: Secondary | ICD-10-CM | POA: Diagnosis not present

## 2020-09-13 DIAGNOSIS — E039 Hypothyroidism, unspecified: Secondary | ICD-10-CM | POA: Diagnosis present

## 2020-09-13 LAB — PROTIME-INR
INR: 1.6 — ABNORMAL HIGH (ref 0.8–1.2)
Prothrombin Time: 18.9 seconds — ABNORMAL HIGH (ref 11.4–15.2)

## 2020-09-13 LAB — CBC WITH DIFFERENTIAL/PLATELET
Abs Immature Granulocytes: 0.08 10*3/uL — ABNORMAL HIGH (ref 0.00–0.07)
Basophils Absolute: 0.1 10*3/uL (ref 0.0–0.1)
Basophils Relative: 1 %
Eosinophils Absolute: 0.4 10*3/uL (ref 0.0–0.5)
Eosinophils Relative: 4 %
HCT: 36.3 % — ABNORMAL LOW (ref 39.0–52.0)
Hemoglobin: 12.4 g/dL — ABNORMAL LOW (ref 13.0–17.0)
Immature Granulocytes: 1 %
Lymphocytes Relative: 13 %
Lymphs Abs: 1.4 10*3/uL (ref 0.7–4.0)
MCH: 35.1 pg — ABNORMAL HIGH (ref 26.0–34.0)
MCHC: 34.2 g/dL (ref 30.0–36.0)
MCV: 102.8 fL — ABNORMAL HIGH (ref 80.0–100.0)
Monocytes Absolute: 0.9 10*3/uL (ref 0.1–1.0)
Monocytes Relative: 8 %
Neutro Abs: 8.3 10*3/uL — ABNORMAL HIGH (ref 1.7–7.7)
Neutrophils Relative %: 73 %
Platelets: 85 10*3/uL — ABNORMAL LOW (ref 150–400)
RBC: 3.53 MIL/uL — ABNORMAL LOW (ref 4.22–5.81)
RDW: 14.7 % (ref 11.5–15.5)
WBC: 11.1 10*3/uL — ABNORMAL HIGH (ref 4.0–10.5)
nRBC: 0 % (ref 0.0–0.2)

## 2020-09-13 LAB — COMPREHENSIVE METABOLIC PANEL
ALT: 34 U/L (ref 0–44)
AST: 59 U/L — ABNORMAL HIGH (ref 15–41)
Albumin: 2.2 g/dL — ABNORMAL LOW (ref 3.5–5.0)
Alkaline Phosphatase: 139 U/L — ABNORMAL HIGH (ref 38–126)
Anion gap: 6 (ref 5–15)
BUN: 23 mg/dL (ref 8–23)
CO2: 24 mmol/L (ref 22–32)
Calcium: 9 mg/dL (ref 8.9–10.3)
Chloride: 101 mmol/L (ref 98–111)
Creatinine, Ser: 0.86 mg/dL (ref 0.61–1.24)
GFR, Estimated: >60 mL/min (ref >60)
Glucose, Bld: 92 mg/dL (ref 70–99)
Potassium: 4.3 mmol/L (ref 3.5–5.1)
Sodium: 131 mmol/L — ABNORMAL LOW (ref 135–145)
Total Bilirubin: 7 mg/dL — ABNORMAL HIGH (ref 0.3–1.2)
Total Protein: 5.5 g/dL — ABNORMAL LOW (ref 6.5–8.1)

## 2020-09-13 LAB — RESP PANEL BY RT-PCR (FLU A&B, COVID) ARPGX2
Influenza A by PCR: NEGATIVE
Influenza B by PCR: NEGATIVE
SARS Coronavirus 2 by RT PCR: NEGATIVE

## 2020-09-13 LAB — PHOSPHORUS: Phosphorus: 3.3 mg/dL (ref 2.5–4.6)

## 2020-09-13 LAB — MAGNESIUM: Magnesium: 1.9 mg/dL (ref 1.7–2.4)

## 2020-09-13 LAB — AMMONIA: Ammonia: 143 umol/L — ABNORMAL HIGH (ref 9–35)

## 2020-09-13 MED ORDER — LACTULOSE 10 GM/15ML PO SOLN: 30.0000 g | Freq: Three times a day (TID) | ORAL | 0 refills | Status: AC

## 2020-09-13 MED ORDER — LACTULOSE 10 GM/15ML PO SOLN
30.0000 g | Freq: Three times a day (TID) | ORAL | Status: DC
  Administered 2020-09-13 (×3): 30 g via ORAL
  Filled 2020-09-13 (×3): qty 60

## 2020-09-13 MED ORDER — SPIRONOLACTONE 25 MG PO TABS
100.0000 mg | ORAL_TABLET | Freq: Every day | ORAL | Status: DC
  Administered 2020-09-13: 100 mg via ORAL
  Filled 2020-09-13: qty 4

## 2020-09-13 MED ORDER — FUROSEMIDE 40 MG PO TABS
40.0000 mg | ORAL_TABLET | Freq: Every day | ORAL | Status: DC
  Administered 2020-09-13: 40 mg via ORAL
  Filled 2020-09-13: qty 1

## 2020-09-13 MED ORDER — ONDANSETRON HCL 4 MG/2ML IJ SOLN
4.0000 mg | Freq: Four times a day (QID) | INTRAMUSCULAR | Status: DC | PRN
  Administered 2020-09-13: 4 mg via INTRAVENOUS
  Filled 2020-09-13: qty 2

## 2020-09-13 NOTE — Progress Notes (Signed)
Subjective:  Patient alert. Trying to eat breakfast. He is lethargic. Slow to respond. Denies abdominal pain. Oriented to person only.   Objective: Vital signs in last 24 hours: Temp:  [96.7 F (35.9 C)-98.2 F (36.8 C)] 98.1 F (36.7 C) (09/01 0405) Pulse Rate:  [68-80] 73 (09/01 0405) Resp:  [14-24] 18 (09/01 0405) BP: (111-148)/(54-70) 122/61 (09/01 0405) SpO2:  [97 %-100 %] 97 % (09/01 0405) Last BM Date: 09/11/20 General:   Alert,  Well-developed, well-nourished, pleasant and cooperative in NAD Head:  Normocephalic and atraumatic. Eyes:  Sclera clear, + icterus.  Abdomen:  Soft, nontender and nondistended. Normal bowel sounds, without guarding, and without rebound.   Extremities:  Without clubbing, deformity or edema. Neurologic:  Alert and  oriented to person only;  +asterixis. Skin:  Intact without significant lesions or rashes.+jaundice Psych:  Alert and cooperative. Normal mood and affect.  Intake/Output from previous day: 08/31 0701 - 09/01 0700 In: 500 [IV Piggyback:500] Out: 700 [Urine:700] Intake/Output this shift: No intake/output data recorded.  Lab Results: CBC Recent Labs    09/12/20 0735 09/13/20 0518  WBC 9.8 11.1*  HGB 13.5 12.4*  HCT 38.5* 36.3*  MCV 103.2* 102.8*  PLT 90* 85*   BMET Recent Labs    09/12/20 0735 09/13/20 0518  NA 129* 131*  K 3.9 4.3  CL 99 101  CO2 24 24  GLUCOSE 114* 92  BUN 19 23  CREATININE 1.05 0.86  CALCIUM 9.4 9.0   LFTs Recent Labs    09/12/20 0735 09/13/20 0518  BILITOT 5.6* 7.0*  ALKPHOS 183* 139*  AST 64* 59*  ALT 37 34  PROT 5.9* 5.5*  ALBUMIN 2.4* 2.2*   Recent Labs    09/12/20 0735  LIPASE 38   PT/INR Recent Labs    09/12/20 0735 09/13/20 0518  LABPROT 19.3* 18.9*  INR 1.6* 1.6*      Imaging Studies: CT Head Wo Contrast  Result Date: 09/12/2020 CLINICAL DATA:  Polytrauma, critical, head/C-spine injury suspected. [place] out of bed. Neck pain. EXAM: CT HEAD WITHOUT CONTRAST CT CERVICAL  SPINE WITHOUT CONTRAST TECHNIQUE: Multidetector CT imaging of the head and cervical spine was performed following the standard protocol without intravenous contrast. Multiplanar CT image reconstructions of the cervical spine were also generated. COMPARISON:  None. FINDINGS: CT HEAD FINDINGS Brain: There is no evidence of an acute infarct, intracranial hemorrhage, mass, midline shift, or extra-axial fluid collection. There is mild cerebral atrophy. Hypodensities in the cerebral white matter bilaterally are nonspecific but compatible with mild chronic small vessel ischemic disease. Vascular: Calcified atherosclerosis at the skull base. No hyperdense vessel. Skull: No fracture or suspicious osseous lesion. Sinuses/Orbits: 1 cm osteoma in a right ethmoid air cell. Trace bilateral mastoid effusions. Unremarkable orbits. Other: None. CT CERVICAL SPINE FINDINGS Alignment: Normal. Skull base and vertebrae: No acute fracture suspicious osseous lesion. Soft tissues and spinal canal: No prevertebral fluid or swelling. No visible canal hematoma. Disc levels: Mild cervical spondylosis, scattered mild posterior longitudinal ligament ossification, and mild facet arthrosis. Mild multilevel spinal and neural foraminal stenosis. Upper chest: Partially visualized small right pleural effusion. Other: Moderate calcific atherosclerosis at the carotid bifurcations. IMPRESSION: 1. No evidence of acute intracranial abnormality. 2. Mild chronic small vessel ischemic disease and cerebral atrophy. 3. No acute cervical spine fracture or traumatic subluxation. 4. Electronically Signed   By: Logan Bores M.D.   On: 09/12/2020 08:34   CT Cervical Spine Wo Contrast  Result Date: 09/12/2020 CLINICAL DATA:  Polytrauma, critical, head/C-spine  injury suspected. [place] out of bed. Neck pain. EXAM: CT HEAD WITHOUT CONTRAST CT CERVICAL SPINE WITHOUT CONTRAST TECHNIQUE: Multidetector CT imaging of the head and cervical spine was performed following the  standard protocol without intravenous contrast. Multiplanar CT image reconstructions of the cervical spine were also generated. COMPARISON:  None. FINDINGS: CT HEAD FINDINGS Brain: There is no evidence of an acute infarct, intracranial hemorrhage, mass, midline shift, or extra-axial fluid collection. There is mild cerebral atrophy. Hypodensities in the cerebral white matter bilaterally are nonspecific but compatible with mild chronic small vessel ischemic disease. Vascular: Calcified atherosclerosis at the skull base. No hyperdense vessel. Skull: No fracture or suspicious osseous lesion. Sinuses/Orbits: 1 cm osteoma in a right ethmoid air cell. Trace bilateral mastoid effusions. Unremarkable orbits. Other: None. CT CERVICAL SPINE FINDINGS Alignment: Normal. Skull base and vertebrae: No acute fracture suspicious osseous lesion. Soft tissues and spinal canal: No prevertebral fluid or swelling. No visible canal hematoma. Disc levels: Mild cervical spondylosis, scattered mild posterior longitudinal ligament ossification, and mild facet arthrosis. Mild multilevel spinal and neural foraminal stenosis. Upper chest: Partially visualized small right pleural effusion. Other: Moderate calcific atherosclerosis at the carotid bifurcations. IMPRESSION: 1. No evidence of acute intracranial abnormality. 2. Mild chronic small vessel ischemic disease and cerebral atrophy. 3. No acute cervical spine fracture or traumatic subluxation. 4. Electronically Signed   By: Logan Bores M.D.   On: 09/12/2020 08:34   DG CHEST PORT 1 VIEW  Result Date: 09/12/2020 CLINICAL DATA:  Altered mental status EXAM: PORTABLE CHEST 1 VIEW COMPARISON:  Chest radiograph 06/26/2020 FINDINGS: The cardiomediastinal silhouette is grossly stable. Lung volumes are low. Coarsened interstitial markings are again seen throughout both lungs, unchanged. There is no new focal airspace disease. There is no significant pleural effusion. There is no pneumothorax. There  is no acute osseous abnormality. IMPRESSION: Coarsened interstitial markings throughout both lungs are unchanged. No new focal airspace disease. Electronically Signed   By: Valetta Mole M.D.   On: 09/12/2020 15:47  [2 weeks]   Assessment: 70 year old male with past medical history significant for anemia, anxiety, HTN, cirrhosis complicated by hepatic encephalopathy/SBP/progressive decompensation who is currently under hospice care, presenting with leg weakness and recent falls. GI consulted for determination of goals of treatment regarding patient's cirrhosis.  Decompensated cirrhosis: presumed to be from prior alcohol abuse, quitting in November 2021.  Serological work-up negative for other causes. Patient has declined transplant evaluation. Last paracentesis in June 2022, cell count showing PMN of 573, concerning for SBP.  He was admitted and treated with IV Rocephin/albumin, course complicated by acute kidney injury as well.  As an outpatient has been maintained on oral Cipro for SBP prophylaxis.  EGD December 2021 without evidence of esophageal varices.  As an outpatient he had been on Xifaxan and lactulose for previous history of HE and lasix/aldactone as diuretic regimen. Spoke to Dr. Abbey Chatters, hospice reportedly not agreeable to providing Xifaxan as it was considered "curative medication". Patient with history of noncompliance with lactulose. Presenting now with hepatic encephalopathy. Admission ammonia level of 68, up to 143 today. Alert but lethargic and alert to person only. Slow to respond verbally. MELD-NA this admission 25-->23.    Plan: Continue lactulose, increase to tid. Goal of 3-4 soft BMs daily.  Patient reportedly not able to continue Xifaxan under Hospice care as outlined. Xifaxan is not a curative medication. It is needed to reduce hepatic encephalopathy and would be beneficial for quality of life improvements.  Continue cipro for SBP prophylaxis.  Two gram sodium diet.  Continue  lasix and aldactone, currently on hold, this admission. Currently without peripheral edema but is helpful in prevention of further SBP. Palliative consult pending to help with goals. Dr. Abbey Chatters to contact patient's daughter [name] today.    Laureen Ochs. Bernarda Caffey [facility] [phone] 9/1/202210:24 AM    LOS: 0 days

## 2020-09-13 NOTE — Discharge Summary (Signed)
Physician Discharge Summary  Marcus Pham [id number] DOB: 12/02/1950 DOA: 09/12/2020  PCP: [name], MD  Admit date: 09/12/2020 Discharge date: 09/13/2020  Time spent: 35 minutes  Recommendations for Outpatient Follow-up:    Hepatic encephalopathy - Discharge Inpatient Hospice  End-stage Liver failure, EtOH cirrhosis - Discharge Inpatient Hospice  EtOH abuse - Discharge Inpatient Hospice  Anxiety, PTSD, major depressive DO - Discharge Inpatient Hospice  Essential HTN - Discharge Inpatient Hospice  HLD - Discharge Inpatient Hospice     Sacral decubitus ulcer stage I Pressure Injury 09/12/20 Sacrum Mid Stage 1 -  Intact skin with non-blanchable redness of a localized area usually over a bony prominence. redness outter and flesh colored inside wound bed, length 2 cm , width 0, depth 0 (Active)  09/12/20 1908  Location: Sacrum  Location Orientation: Mid  Staging: Stage 1 -  Intact skin with non-blanchable redness of a localized area usually over a bony prominence.  Wound Description (Comments): redness outter and flesh colored inside wound bed, length 2 cm , width 0, depth 0  Present on Admission: Yes  - Discharge Inpatient Hospice        Discharge Diagnoses:  Active Problems:   Essential hypertension   Hyperlipidemia   Acute hepatic encephalopathy   Cirrhosis ([place])   Alcohol abuse   Major depressive disorder, recurrent episode, moderate (HCC)   Posttraumatic stress disorder   Anxiety   Hepatic encephalopathy ([place])   Discharge Condition: Guarded  Diet recommendation: Regular diet  Filed Weights   09/12/20 0648  Weight: 90 kg    History of present illness:  Marcus Pham is a 70 y.o.  WM PMHx anxiety, depression, PTSD, HTN, HLD, substance abuse, ETOH abuse, thyroid disease, end-stage liver cirrhosis on hospice care,  Hospital Course:  See above  Consultations: Palliative care  Cultures  8/31 SARS coronavirus negative 9/1 SARS  coronavirus negative   Antibiotics Anti-infectives (From admission, onward)    Start     Ordered Stop   09/13/20 0800  ciprofloxacin (CIPRO) tablet 500 mg        09/12/20 1629     09/12/20 1545  rifaximin (XIFAXAN) tablet 550 mg        09/12/20 1542           Discharge Exam: Vitals:   09/12/20 1906 09/12/20 2337 09/13/20 0405 09/13/20 0816  BP: (!) 136/59 (!) 116/58 122/61 120/79  Pulse: 71 68 73 72  Resp: '20 18 18 17  '$ Temp: 98.2 F (36.8 C) 98 F (36.7 C) 98.1 F (36.7 C) 98.5 F (36.9 C)  TempSrc: Oral   Oral  SpO2: 98% 99% 97% 100%  Weight:      Height:        General: A/O x4, No acute respiratory distress Eyes: negative scleral hemorrhage, negative anisocoria, positive icterus ENT: Negative Runny nose, negative gingival bleeding, Neck:  Negative scars, masses, torticollis, lymphadenopathy, JVD Lungs: Clear to auscultation bilaterally without wheezes or crackles Cardiovascular: Regular rate and rhythm without murmur gallop or rub normal S1 and S2 Abdomen: negative abdominal pain, nondistended, positive soft, bowel sounds, no rebound, no ascites, no appreciable mass Extremities: No significant cyanosis, clubbing, or edema bilateral lower extremities Skin: positive jaundice, stage I sacral decubitus ulcer  Psychiatric:  Negative depression, negative anxiety, negative fatigue, negative mania  Central nervous system:  Cranial nerves II through XII intact, tongue/uvula midline, all extremities muscle strength 5/5, sensation intact throughout, fnegative dysarthria, negative expressive aphasia, negative receptive aphasia.   Discharge  Instructions   Allergies as of 09/13/2020   No Known Allergies      Medication List     STOP taking these medications    LORazepam 0.5 MG tablet Commonly known as: ATIVAN       TAKE these medications    ciprofloxacin 500 MG tablet Commonly known as: CIPRO Take 1 tablet (500 mg total) by mouth daily with breakfast.    cyclobenzaprine 10 MG tablet Commonly known as: FLEXERIL Take 10 mg by mouth at bedtime as needed for muscle spasms.   furosemide 40 MG tablet Commonly known as: Lasix Take 1 tablet (40 mg total) by mouth daily.   HYDROcodone-acetaminophen 5-325 MG tablet Commonly known as: NORCO/VICODIN Take 1 tablet by mouth every 4 (four) hours as needed for moderate pain.   lactulose 10 GM/15ML solution Commonly known as: CHRONULAC Take 45 mLs (30 g total) by mouth 3 (three) times daily. What changed: when to take this   omeprazole 20 MG capsule Commonly known as: PRILOSEC Take 20 mg by mouth as needed (acid reflux).   ondansetron 4 MG tablet Commonly known as: ZOFRAN Take 4 mg by mouth every 8 (eight) hours as needed for nausea or vomiting.   rifaximin 550 MG Tabs tablet Commonly known as: XIFAXAN Take 1 tablet (550 mg total) by mouth 2 (two) times daily. What changed: when to take this   spironolactone 100 MG tablet Commonly known as: Aldactone Take 1 tablet (100 mg total) by mouth daily.       No Known Allergies  Contact information for after-discharge care     [facility] .   Service: Inpatient Hospice Contact information: [address] [place] [phone]                      The results of significant diagnostics from this hospitalization (including imaging, microbiology, ancillary and laboratory) are listed below for reference.    Significant Diagnostic Studies: CT Head Wo Contrast  Result Date: 09/12/2020 CLINICAL DATA:  Polytrauma, critical, head/C-spine injury suspected. [place] out of bed. Neck pain. EXAM: CT HEAD WITHOUT CONTRAST CT CERVICAL SPINE WITHOUT CONTRAST TECHNIQUE: Multidetector CT imaging of the head and cervical spine was performed following the standard protocol without intravenous contrast. Multiplanar CT image reconstructions of the cervical spine were also generated.  COMPARISON:  None. FINDINGS: CT HEAD FINDINGS Brain: There is no evidence of an acute infarct, intracranial hemorrhage, mass, midline shift, or extra-axial fluid collection. There is mild cerebral atrophy. Hypodensities in the cerebral white matter bilaterally are nonspecific but compatible with mild chronic small vessel ischemic disease. Vascular: Calcified atherosclerosis at the skull base. No hyperdense vessel. Skull: No fracture or suspicious osseous lesion. Sinuses/Orbits: 1 cm osteoma in a right ethmoid air cell. Trace bilateral mastoid effusions. Unremarkable orbits. Other: None. CT CERVICAL SPINE FINDINGS Alignment: Normal. Skull base and vertebrae: No acute fracture suspicious osseous lesion. Soft tissues and spinal canal: No prevertebral fluid or swelling. No visible canal hematoma. Disc levels: Mild cervical spondylosis, scattered mild posterior longitudinal ligament ossification, and mild facet arthrosis. Mild multilevel spinal and neural foraminal stenosis. Upper chest: Partially visualized small right pleural effusion. Other: Moderate calcific atherosclerosis at the carotid bifurcations. IMPRESSION: 1. No evidence of acute intracranial abnormality. 2. Mild chronic small vessel ischemic disease and cerebral atrophy. 3. No acute cervical spine fracture or traumatic subluxation. 4. Electronically Signed   By: Logan Bores M.D.   On:  09/12/2020 08:34   CT Cervical Spine Wo Contrast  Result Date: 09/12/2020 CLINICAL DATA:  Polytrauma, critical, head/C-spine injury suspected. [place] out of bed. Neck pain. EXAM: CT HEAD WITHOUT CONTRAST CT CERVICAL SPINE WITHOUT CONTRAST TECHNIQUE: Multidetector CT imaging of the head and cervical spine was performed following the standard protocol without intravenous contrast. Multiplanar CT image reconstructions of the cervical spine were also generated. COMPARISON:  None. FINDINGS: CT HEAD FINDINGS Brain: There is no evidence of an acute infarct, intracranial hemorrhage,  mass, midline shift, or extra-axial fluid collection. There is mild cerebral atrophy. Hypodensities in the cerebral white matter bilaterally are nonspecific but compatible with mild chronic small vessel ischemic disease. Vascular: Calcified atherosclerosis at the skull base. No hyperdense vessel. Skull: No fracture or suspicious osseous lesion. Sinuses/Orbits: 1 cm osteoma in a right ethmoid air cell. Trace bilateral mastoid effusions. Unremarkable orbits. Other: None. CT CERVICAL SPINE FINDINGS Alignment: Normal. Skull base and vertebrae: No acute fracture suspicious osseous lesion. Soft tissues and spinal canal: No prevertebral fluid or swelling. No visible canal hematoma. Disc levels: Mild cervical spondylosis, scattered mild posterior longitudinal ligament ossification, and mild facet arthrosis. Mild multilevel spinal and neural foraminal stenosis. Upper chest: Partially visualized small right pleural effusion. Other: Moderate calcific atherosclerosis at the carotid bifurcations. IMPRESSION: 1. No evidence of acute intracranial abnormality. 2. Mild chronic small vessel ischemic disease and cerebral atrophy. 3. No acute cervical spine fracture or traumatic subluxation. 4. Electronically Signed   By: Logan Bores M.D.   On: 09/12/2020 08:34   DG CHEST PORT 1 VIEW  Result Date: 09/12/2020 CLINICAL DATA:  Altered mental status EXAM: PORTABLE CHEST 1 VIEW COMPARISON:  Chest radiograph 06/26/2020 FINDINGS: The cardiomediastinal silhouette is grossly stable. Lung volumes are low. Coarsened interstitial markings are again seen throughout both lungs, unchanged. There is no new focal airspace disease. There is no significant pleural effusion. There is no pneumothorax. There is no acute osseous abnormality. IMPRESSION: Coarsened interstitial markings throughout both lungs are unchanged. No new focal airspace disease. Electronically Signed   By: Valetta Mole M.D.   On: 09/12/2020 15:47    Microbiology: Recent Results  (from the past 240 hour(s))  Culture, blood (routine x 2)     Status: None (Preliminary result)   Collection Time: 09/12/20  3:18 PM   Specimen: Left Antecubital; Blood  Result Value Ref Range Status   Specimen Description LEFT ANTECUBITAL  Final   Special Requests   Final    BOTTLES DRAWN AEROBIC AND ANAEROBIC Blood Culture results may not be optimal due to an inadequate volume of blood received in culture bottles   Culture   Final    NO GROWTH < 24 HOURS Performed at [facility], [address], [place], [place] [address]    Report Status PENDING  Incomplete  Culture, blood (routine x 2)     Status: None (Preliminary result)   Collection Time: 09/12/20  3:19 PM   Specimen: BLOOD LEFT HAND  Result Value Ref Range Status   Specimen Description BLOOD LEFT HAND  Final   Special Requests   Final    AEROBIC BOTTLE ONLY Blood Culture results may not be optimal due to an inadequate volume of blood received in culture bottles   Culture   Final    NO GROWTH < 24 HOURS Performed at [facility], [address] [place], [place] [address]    Report Status PENDING  Incomplete  Resp Panel by RT-PCR (Flu A&B, Covid) Nasopharyngeal Swab  Status: None   Collection Time: 09/12/20  5:51 PM   Specimen: Nasopharyngeal Swab; Nasopharyngeal(NP) swabs in vial transport medium  Result Value Ref Range Status   SARS Coronavirus 2 by RT PCR NEGATIVE NEGATIVE Final    Comment: (NOTE) SARS-CoV-2 target nucleic acids are NOT DETECTED.  The SARS-CoV-2 RNA is generally detectable in upper respiratory specimens during the acute phase of infection. The lowest concentration of SARS-CoV-2 viral copies this assay can detect is 138 copies/mL. A negative result does not preclude SARS-Cov-2 infection and should not be used as the sole basis for treatment or other patient management decisions. A negative result may occur with  improper specimen collection/handling, submission of specimen other than nasopharyngeal  swab, presence of viral mutation(s) within the areas targeted by this assay, and inadequate number of viral copies(<138 copies/mL). A negative result must be combined with clinical observations, patient history, and epidemiological information. The expected result is Negative.  Fact Sheet for Patients:  EntrepreneurPulse.com.au  Fact Sheet for Healthcare Providers:  IncredibleEmployment.be  This test is no t yet approved or cleared by the [place] FDA and  has been authorized for detection and/or diagnosis of SARS-CoV-2 by FDA under an Emergency Use Authorization (EUA). This EUA will remain  in effect (meaning this test can be used) for the duration of the COVID-19 declaration under Section 564(b)(1) of the Act, 21 U.S.C.section 360bbb-3(b)(1), unless the authorization is terminated  or revoked sooner.       Influenza A by PCR NEGATIVE NEGATIVE Final   Influenza B by PCR NEGATIVE NEGATIVE Final    Comment: (NOTE) The Xpert Xpress SARS-CoV-2/FLU/RSV plus assay is intended as an aid in the diagnosis of influenza from Nasopharyngeal swab specimens and should not be used as a sole basis for treatment. Nasal washings and aspirates are unacceptable for Xpert Xpress SARS-CoV-2/FLU/RSV testing.  Fact Sheet for Patients: EntrepreneurPulse.com.au  Fact Sheet for Healthcare Providers: IncredibleEmployment.be  This test is not yet approved or cleared by the [place] FDA and has been authorized for detection and/or diagnosis of SARS-CoV-2 by FDA under an Emergency Use Authorization (EUA). This EUA will remain in effect (meaning this test can be used) for the duration of the COVID-19 declaration under Section 564(b)(1) of the Act, 21 U.S.C. section 360bbb-3(b)(1), unless the authorization is terminated or revoked.  Performed at [facility], [address] [place], [place] [address]   Resp Panel by RT-PCR  (Flu A&B, Covid) Nasopharyngeal Swab     Status: None   Collection Time: 09/13/20 11:27 AM   Specimen: Nasopharyngeal Swab; Nasopharyngeal(NP) swabs in vial transport medium  Result Value Ref Range Status   SARS Coronavirus 2 by RT PCR NEGATIVE NEGATIVE Final    Comment: (NOTE) SARS-CoV-2 target nucleic acids are NOT DETECTED.  The SARS-CoV-2 RNA is generally detectable in upper respiratory specimens during the acute phase of infection. The lowest concentration of SARS-CoV-2 viral copies this assay can detect is 138 copies/mL. A negative result does not preclude SARS-Cov-2 infection and should not be used as the sole basis for treatment or other patient management decisions. A negative result may occur with  improper specimen collection/handling, submission of specimen other than nasopharyngeal swab, presence of viral mutation(s) within the areas targeted by this assay, and inadequate number of viral copies(<138 copies/mL). A negative result must be combined with clinical observations, patient history, and epidemiological information. The expected result is Negative.  Fact Sheet for Patients:  EntrepreneurPulse.com.au  Fact Sheet for Healthcare Providers:  IncredibleEmployment.be  This  test is no t yet approved or cleared by the [place] and  has been authorized for detection and/or diagnosis of SARS-CoV-2 by FDA under an Emergency Use Authorization (EUA). This EUA will remain  in effect (meaning this test can be used) for the duration of the COVID-19 declaration under Section 564(b)(1) of the Act, 21 U.S.C.section 360bbb-3(b)(1), unless the authorization is terminated  or revoked sooner.       Influenza A by PCR NEGATIVE NEGATIVE Final   Influenza B by PCR NEGATIVE NEGATIVE Final    Comment: (NOTE) The Xpert Xpress SARS-CoV-2/FLU/RSV plus assay is intended as an aid in the diagnosis of influenza from Nasopharyngeal swab specimens  and should not be used as a sole basis for treatment. Nasal washings and aspirates are unacceptable for Xpert Xpress SARS-CoV-2/FLU/RSV testing.  Fact Sheet for Patients: EntrepreneurPulse.com.au  Fact Sheet for Healthcare Providers: IncredibleEmployment.be  This test is not yet approved or cleared by the [place] FDA and has been authorized for detection and/or diagnosis of SARS-CoV-2 by FDA under an Emergency Use Authorization (EUA). This EUA will remain in effect (meaning this test can be used) for the duration of the COVID-19 declaration under Section 564(b)(1) of the Act, 21 U.S.C. section 360bbb-3(b)(1), unless the authorization is terminated or revoked.  Performed at [facility], [address], [place], [place] [address]      Labs: Basic Metabolic Panel: Recent Labs  Lab 09/12/20 0735 09/13/20 0518  NA 129* 131*  K 3.9 4.3  CL 99 101  CO2 24 24  GLUCOSE 114* 92  BUN 19 23  CREATININE 1.05 0.86  CALCIUM 9.4 9.0  MG  --  1.9  PHOS  --  3.3   Liver Function Tests: Recent Labs  Lab 09/12/20 0735 09/13/20 0518  AST 64* 59*  ALT 37 34  ALKPHOS 183* 139*  BILITOT 5.6* 7.0*  PROT 5.9* 5.5*  ALBUMIN 2.4* 2.2*   Recent Labs  Lab 09/12/20 0735  LIPASE 38   Recent Labs  Lab 09/12/20 0738 09/13/20 0518  AMMONIA 68* 143*   CBC: Recent Labs  Lab 09/12/20 0735 09/13/20 0518  WBC 9.8 11.1*  NEUTROABS 7.9* 8.3*  HGB 13.5 12.4*  HCT 38.5* 36.3*  MCV 103.2* 102.8*  PLT 90* 85*   Cardiac Enzymes: No results for input(s): CKTOTAL, CKMB, CKMBINDEX, TROPONINI in the last 168 hours. BNP: BNP (last 3 results) Recent Labs    09/12/20 0736  BNP 36.0    ProBNP (last 3 results) No results for input(s): PROBNP in the last 8760 hours.  CBG: No results for input(s): GLUCAP in the last 168 hours.     Signed:  Dia Crawford, MD [facility]

## 2020-09-13 NOTE — TOC Transition Note (Signed)
Transition of Care [facility]) - CM/SW Discharge Note   Patient Details  Name: Marcus Pham MRN: [id number] Date of Birth: August 26, 1950  [facility]) CM/SW Contact:  Boneta Lucks, RN Phone Number: 09/13/2020, 12:30 PM   Clinical Narrative:   Maudie Mercury with [facility] hospice has assessed patient here at [facility]. Family is agreeable to transfer to [facility]. COVID test ordered. RN to call report. MD to complete DC and EMS will be scheduled. Med necessity completed.   Final next level of care: [place] Barriers to Discharge: Barriers Resolved  Patient Goals and CMS Choice Patient states their goals for this hospitalization and ongoing recovery are:: agreeable to residential hospice. CMS Medicare.gov Compare Post Acute Care list provided to:: Patient Represenative (must comment) Choice offered to / list presented to : Adult Children  Discharge Placement      [facility] house   Discharge Plan and Services    Post Acute Care Choice: Hospice             Readmission Risk Interventions Readmission Risk Prevention Plan 09/13/2020  Post Dischage Appt Complete  Transportation Screening Complete  Some recent data might be hidden

## 2020-09-13 NOTE — Progress Notes (Signed)
[facility] night shift telemetry coverage note.  The nursing staff reported that the patient requested an antiemetic as he feels nauseated.  Ondansetron 4 mg IVP every 4 hours as needed ordered.  Tennis Must, MD.

## 2020-09-13 NOTE — Progress Notes (Signed)
Palliative: Conference with attending related to ER visit and expected admission on 09/12/2020.  Marcus Pham is active with hospice of [facility].  Transition of care team notified he reaches out to hospice.  09/13/2020 Marcus Pham's family has elected residential hospice for comfort and dignity at end-of-life.  He is to be transferred today.  [place] form completed and placed on chart.  Conference with attending related to patient condition, needs, goals of care, disposition.  Plan: Comfort and dignity at end-of-life, residential hospice with Marcus Pham house.  No charge Marcus Axe, NP Palliative medicine team Team phone [phone] Greater than 50% of this time was spent counseling and coordinating care related to the above assessment and plan.

## 2020-09-14 NOTE — Progress Notes (Signed)
EMS in to transport patient to [facility] via stretcher at this time. Attempted to call [facility] to make aware patient was in route with no answer, although previously made aware. Patient left in stable condition, cleansed of incontinent episode before discharge.

## 2020-09-17 LAB — CULTURE, BLOOD (ROUTINE X 2)
Culture: NO GROWTH
Culture: NO GROWTH

## 2020-09-26 ENCOUNTER — Ambulatory Visit: Payer: No Typology Code available for payment source | Admitting: Internal Medicine

## 2020-10-01 ENCOUNTER — Ambulatory Visit: Payer: Non-veteran care | Admitting: Internal Medicine

## 2020-10-13 DEATH — deceased

## 2021-10-08 IMAGING — DX DG CHEST 2V
2 series · 2 of 2 positions shown · non-contrast
Comparison: 11/14/2013

CLINICAL DATA: Cough

EXAM:
CHEST - 2 VIEW

[chest pa]
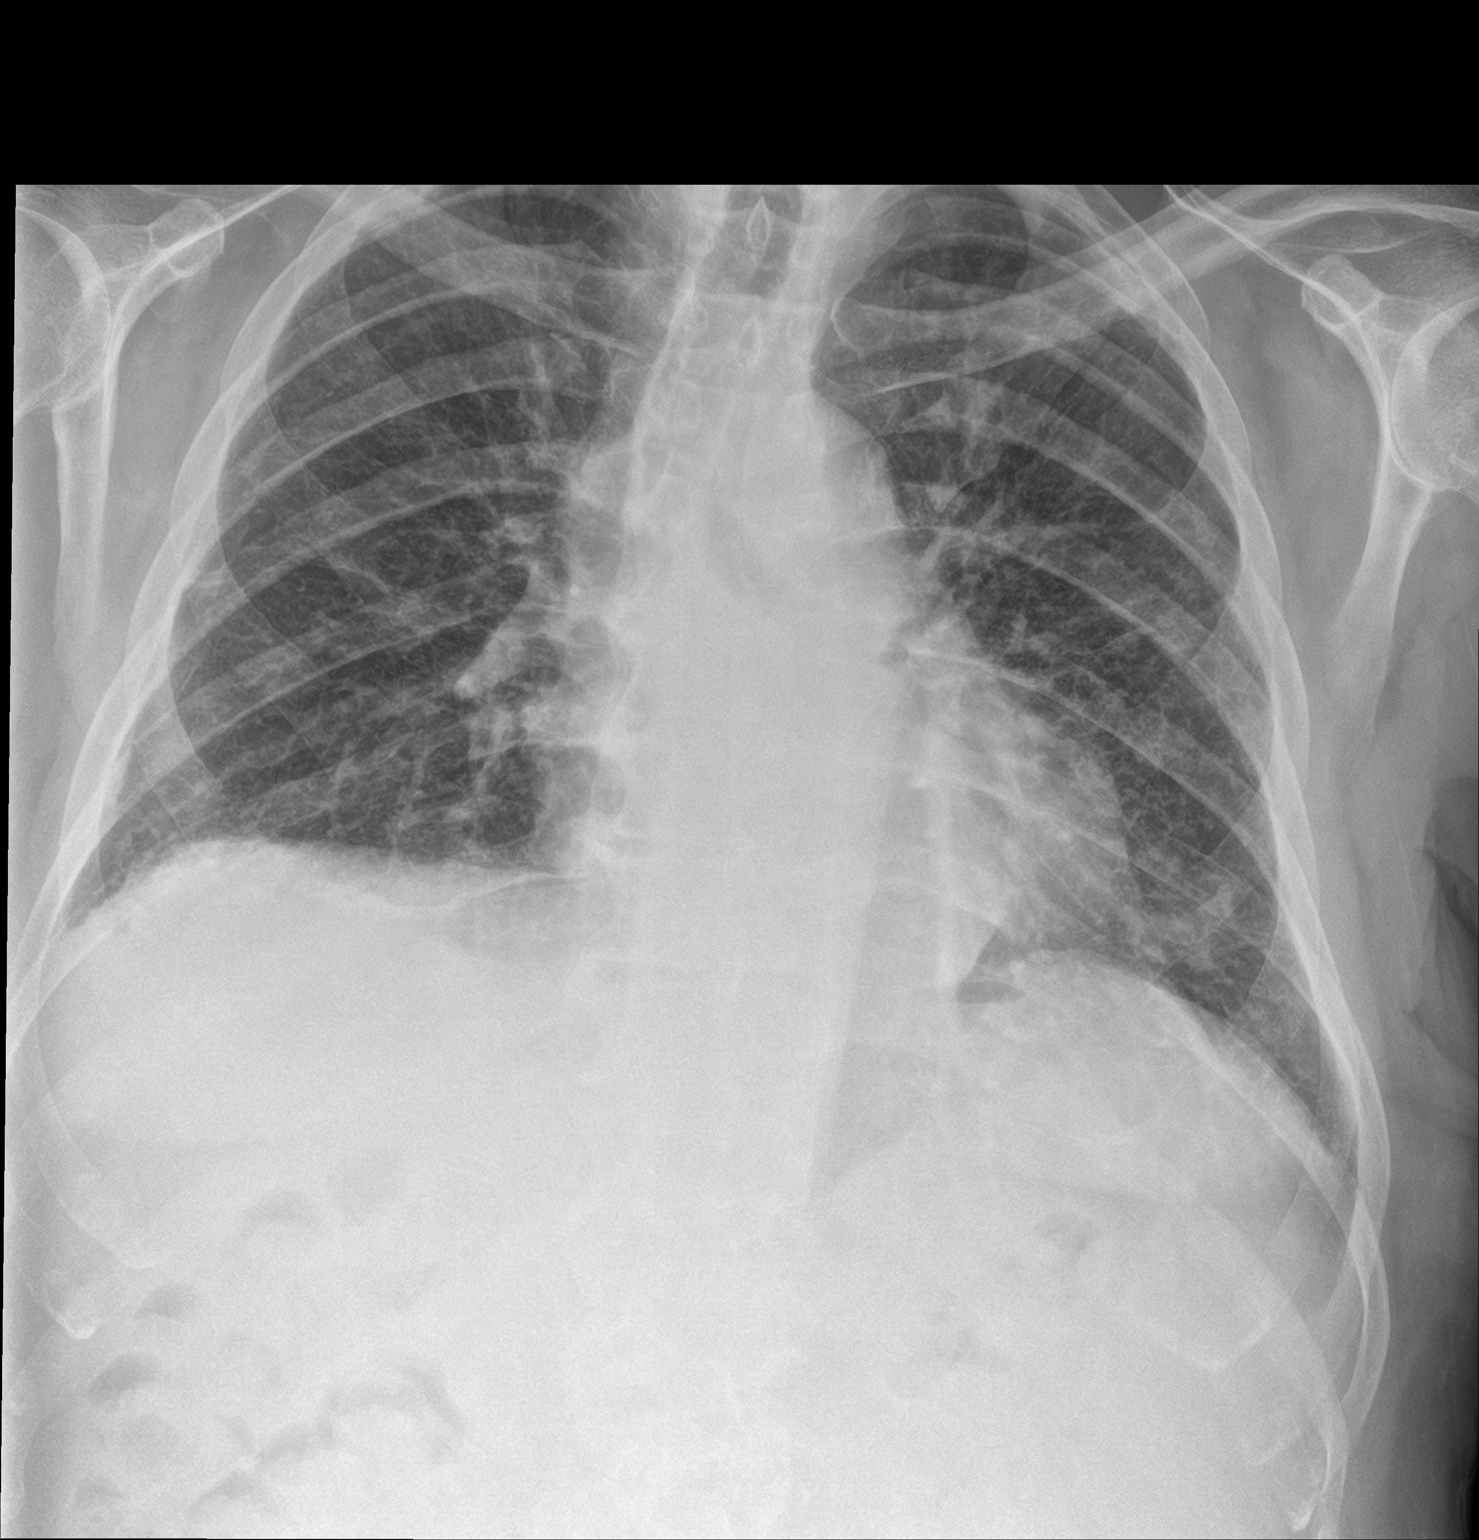

[chest lat]
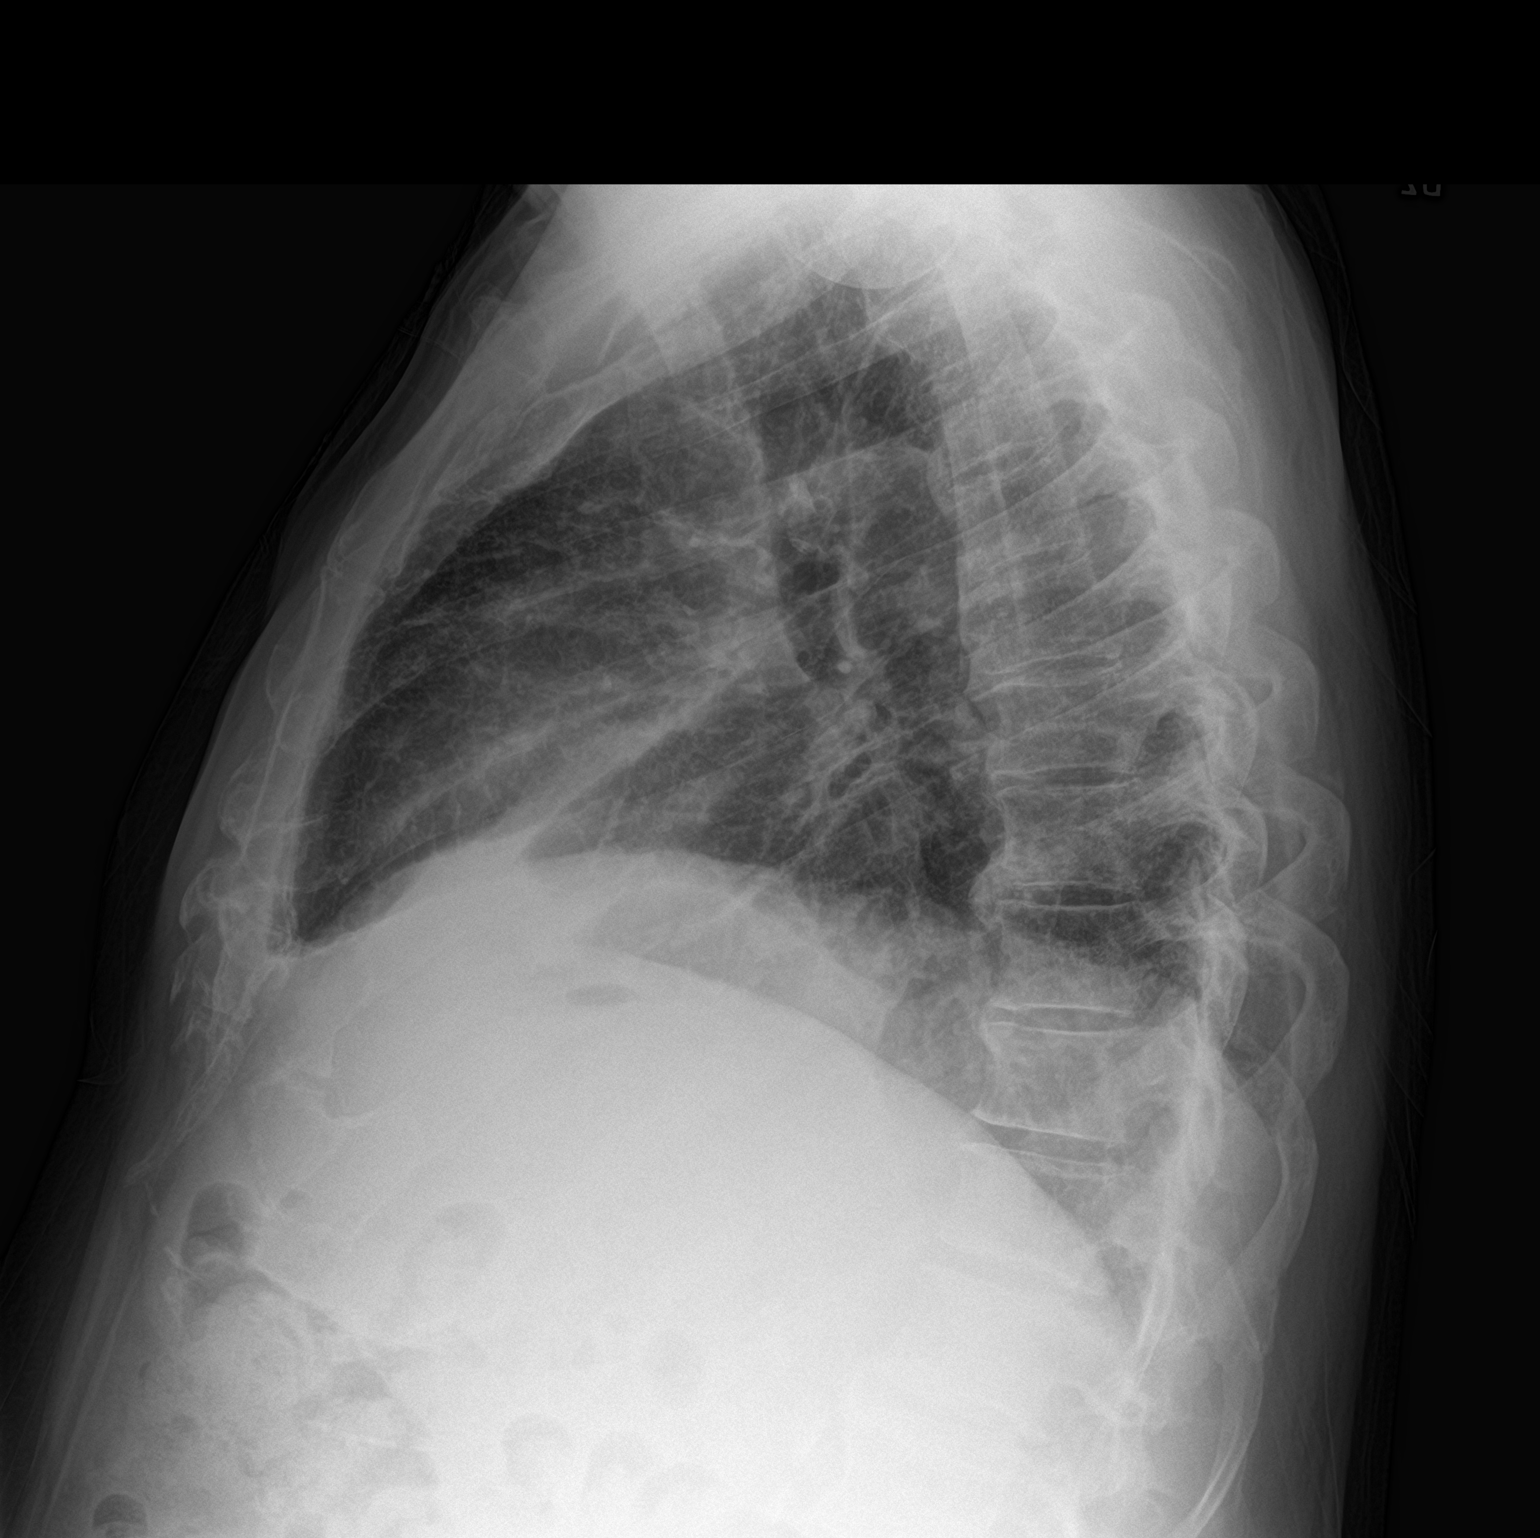

[2 of 2 positions shown; findings below may reference images not displayed]

FINDINGS: The heart size and mediastinal contours are within normal limits.
Atherosclerotic calcification of the aortic knob. Coarsened
interstitial markings within the lung bases. Streaky left basilar
opacity. No pleural effusion or pneumothorax. The visualized
skeletal structures are unremarkable.
IMPRESSION: 1. Streaky left basilar opacity, which may reflect atelectasis
versus pneumonia.
2. Coarsened interstitial markings within the lung bases may reflect
bronchitic type lung changes.
